# Patient Record
Sex: Male | Born: 1935 | Race: Black or African American | Hispanic: No | Marital: Married | Smoking: Never smoker
Health system: Southern US, Community
[De-identification: ages and names within clinical notes are randomized; demographics above are authoritative.]

## PROBLEM LIST (undated history)

## (undated) DIAGNOSIS — E785 Hyperlipidemia, unspecified: Secondary | ICD-10-CM

## (undated) DIAGNOSIS — F0390 Unspecified dementia without behavioral disturbance: Secondary | ICD-10-CM

## (undated) DIAGNOSIS — E119 Type 2 diabetes mellitus without complications: Secondary | ICD-10-CM

## (undated) DIAGNOSIS — N183 Chronic kidney disease, stage 3 unspecified: Secondary | ICD-10-CM

## (undated) DIAGNOSIS — D649 Anemia, unspecified: Secondary | ICD-10-CM

## (undated) DIAGNOSIS — I10 Essential (primary) hypertension: Secondary | ICD-10-CM

---

## 2004-06-12 ENCOUNTER — Ambulatory Visit: Payer: Self-pay

## 2004-06-30 ENCOUNTER — Ambulatory Visit: Payer: Self-pay | Admitting: Internal Medicine

## 2004-07-31 ENCOUNTER — Ambulatory Visit: Payer: Self-pay | Admitting: Internal Medicine

## 2004-11-22 ENCOUNTER — Emergency Department: Payer: Self-pay | Admitting: Emergency Medicine

## 2007-10-12 ENCOUNTER — Emergency Department: Payer: Self-pay | Admitting: Emergency Medicine

## 2007-10-12 ENCOUNTER — Other Ambulatory Visit: Payer: Self-pay

## 2015-11-01 ENCOUNTER — Emergency Department: Payer: Medicare (Managed Care)

## 2015-11-01 ENCOUNTER — Observation Stay
Admission: EM | Admit: 2015-11-01 | Discharge: 2015-11-03 | Disposition: A | Discharge status: Skilled Nursing Facility | Payer: Medicare (Managed Care) | Attending: Internal Medicine | Admitting: Internal Medicine

## 2015-11-01 ENCOUNTER — Encounter: Payer: Self-pay | Admitting: Emergency Medicine

## 2015-11-01 DIAGNOSIS — E785 Hyperlipidemia, unspecified: Secondary | ICD-10-CM | POA: Insufficient documentation

## 2015-11-01 DIAGNOSIS — I517 Cardiomegaly: Secondary | ICD-10-CM | POA: Insufficient documentation

## 2015-11-01 DIAGNOSIS — Z7982 Long term (current) use of aspirin: Secondary | ICD-10-CM | POA: Insufficient documentation

## 2015-11-01 DIAGNOSIS — J111 Influenza due to unidentified influenza virus with other respiratory manifestations: Secondary | ICD-10-CM

## 2015-11-01 DIAGNOSIS — I350 Nonrheumatic aortic (valve) stenosis: Secondary | ICD-10-CM | POA: Insufficient documentation

## 2015-11-01 DIAGNOSIS — R52 Pain, unspecified: Secondary | ICD-10-CM

## 2015-11-01 DIAGNOSIS — E860 Dehydration: Secondary | ICD-10-CM | POA: Diagnosis present

## 2015-11-01 DIAGNOSIS — R600 Localized edema: Secondary | ICD-10-CM | POA: Diagnosis not present

## 2015-11-01 DIAGNOSIS — R778 Other specified abnormalities of plasma proteins: Secondary | ICD-10-CM

## 2015-11-01 DIAGNOSIS — N19 Unspecified kidney failure: Secondary | ICD-10-CM

## 2015-11-01 DIAGNOSIS — J101 Influenza due to other identified influenza virus with other respiratory manifestations: Secondary | ICD-10-CM | POA: Diagnosis not present

## 2015-11-01 DIAGNOSIS — F0391 Unspecified dementia with behavioral disturbance: Secondary | ICD-10-CM | POA: Diagnosis not present

## 2015-11-01 DIAGNOSIS — E1122 Type 2 diabetes mellitus with diabetic chronic kidney disease: Secondary | ICD-10-CM | POA: Insufficient documentation

## 2015-11-01 DIAGNOSIS — R32 Unspecified urinary incontinence: Secondary | ICD-10-CM | POA: Insufficient documentation

## 2015-11-01 DIAGNOSIS — R740 Nonspecific elevation of levels of transaminase and lactic acid dehydrogenase [LDH]: Secondary | ICD-10-CM | POA: Insufficient documentation

## 2015-11-01 DIAGNOSIS — E119 Type 2 diabetes mellitus without complications: Secondary | ICD-10-CM

## 2015-11-01 DIAGNOSIS — F0390 Unspecified dementia without behavioral disturbance: Secondary | ICD-10-CM | POA: Diagnosis not present

## 2015-11-01 DIAGNOSIS — R531 Weakness: Secondary | ICD-10-CM | POA: Diagnosis not present

## 2015-11-01 DIAGNOSIS — N179 Acute kidney failure, unspecified: Secondary | ICD-10-CM | POA: Diagnosis not present

## 2015-11-01 DIAGNOSIS — N183 Chronic kidney disease, stage 3 unspecified: Secondary | ICD-10-CM

## 2015-11-01 DIAGNOSIS — R748 Abnormal levels of other serum enzymes: Secondary | ICD-10-CM | POA: Diagnosis not present

## 2015-11-01 DIAGNOSIS — I129 Hypertensive chronic kidney disease with stage 1 through stage 4 chronic kidney disease, or unspecified chronic kidney disease: Secondary | ICD-10-CM | POA: Diagnosis not present

## 2015-11-01 DIAGNOSIS — R7401 Elevation of levels of liver transaminase levels: Secondary | ICD-10-CM

## 2015-11-01 DIAGNOSIS — K219 Gastro-esophageal reflux disease without esophagitis: Secondary | ICD-10-CM | POA: Diagnosis not present

## 2015-11-01 DIAGNOSIS — R7989 Other specified abnormal findings of blood chemistry: Secondary | ICD-10-CM

## 2015-11-01 HISTORY — DX: Anemia, unspecified: D64.9

## 2015-11-01 HISTORY — DX: Chronic kidney disease, stage 3 (moderate): N18.3

## 2015-11-01 HISTORY — DX: Hyperlipidemia, unspecified: E78.5

## 2015-11-01 HISTORY — DX: Unspecified dementia, unspecified severity, without behavioral disturbance, psychotic disturbance, mood disturbance, and anxiety: F03.90

## 2015-11-01 HISTORY — DX: Essential (primary) hypertension: I10

## 2015-11-01 HISTORY — DX: Chronic kidney disease, stage 3 unspecified: N18.30

## 2015-11-01 LAB — CBC
HCT: 39.9 % — ABNORMAL LOW (ref 40.0–52.0)
Hemoglobin: 13 g/dL (ref 13.0–18.0)
MCH: 29.5 pg (ref 26.0–34.0)
MCHC: 32.5 g/dL (ref 32.0–36.0)
MCV: 90.6 fL (ref 80.0–100.0)
Platelets: 146 10*3/uL — ABNORMAL LOW (ref 150–440)
RBC: 4.4 MIL/uL (ref 4.40–5.90)
RDW: 13.4 % (ref 11.5–14.5)
WBC: 12.5 10*3/uL — ABNORMAL HIGH (ref 3.8–10.6)

## 2015-11-01 LAB — COMPREHENSIVE METABOLIC PANEL
ALT: 38 U/L (ref 17–63)
AST: 152 U/L — AB (ref 15–41)
Albumin: 3.6 g/dL (ref 3.5–5.0)
Alkaline Phosphatase: 40 U/L (ref 38–126)
Anion gap: 10 (ref 5–15)
BUN: 27 mg/dL — AB (ref 6–20)
CHLORIDE: 106 mmol/L (ref 101–111)
CO2: 21 mmol/L — AB (ref 22–32)
CREATININE: 1.32 mg/dL — AB (ref 0.61–1.24)
Calcium: 8.1 mg/dL — ABNORMAL LOW (ref 8.9–10.3)
GFR calc non Af Amer: 45 mL/min — ABNORMAL LOW (ref >60)
GFR, EST AFRICAN AMERICAN: 53 mL/min — AB (ref >60)
Glucose, Bld: 286 mg/dL — ABNORMAL HIGH (ref 65–99)
POTASSIUM: 4.2 mmol/L (ref 3.5–5.1)
SODIUM: 137 mmol/L (ref 135–145)
Total Bilirubin: 0.6 mg/dL (ref 0.3–1.2)
Total Protein: 7 g/dL (ref 6.5–8.1)

## 2015-11-01 LAB — URINALYSIS COMPLETE WITH MICROSCOPIC (ARMC ONLY)
Bacteria, UA: NONE SEEN
Bilirubin Urine: NEGATIVE
LEUKOCYTES UA: NEGATIVE
Nitrite: NEGATIVE
Protein, ur: 100 mg/dL — AB
SPECIFIC GRAVITY, URINE: 1.025 (ref 1.005–1.030)
pH: 5 (ref 5.0–8.0)

## 2015-11-01 LAB — CREATININE, SERUM
CREATININE: 1.19 mg/dL (ref 0.61–1.24)
GFR calc Af Amer: 60 mL/min — ABNORMAL LOW (ref >60)
GFR, EST NON AFRICAN AMERICAN: 52 mL/min — AB (ref >60)

## 2015-11-01 LAB — CBC WITH DIFFERENTIAL/PLATELET
BASOS PCT: 0 %
Basophils Absolute: 0 10*3/uL (ref 0–0.1)
Eosinophils Absolute: 0 10*3/uL (ref 0–0.7)
Eosinophils Relative: 0 %
HEMATOCRIT: 39.1 % — AB (ref 40.0–52.0)
HEMOGLOBIN: 13 g/dL (ref 13.0–18.0)
Lymphocytes Relative: 5 %
Lymphs Abs: 0.6 10*3/uL — ABNORMAL LOW (ref 1.0–3.6)
MCH: 29.6 pg (ref 26.0–34.0)
MCHC: 33.3 g/dL (ref 32.0–36.0)
MCV: 88.8 fL (ref 80.0–100.0)
MONOS PCT: 6 %
Monocytes Absolute: 0.7 10*3/uL (ref 0.2–1.0)
NEUTROS ABS: 10.5 10*3/uL — AB (ref 1.4–6.5)
NEUTROS PCT: 89 %
PLATELETS: 143 10*3/uL — AB (ref 150–440)
RBC: 4.4 MIL/uL (ref 4.40–5.90)
RDW: 13.4 % (ref 11.5–14.5)
WBC: 11.8 10*3/uL — ABNORMAL HIGH (ref 3.8–10.6)

## 2015-11-01 LAB — GLUCOSE, CAPILLARY: GLUCOSE-CAPILLARY: 155 mg/dL — AB (ref 65–99)

## 2015-11-01 LAB — TROPONIN I
Troponin I: 0.2 ng/mL — ABNORMAL HIGH (ref <0.031)
Troponin I: 0.25 ng/mL — ABNORMAL HIGH (ref <0.031)

## 2015-11-01 LAB — MAGNESIUM: Magnesium: 2.2 mg/dL (ref 1.7–2.4)

## 2015-11-01 MED ORDER — HALOPERIDOL LACTATE 5 MG/ML IJ SOLN
5.0000 mg | Freq: Once | INTRAMUSCULAR | Status: AC
  Administered 2015-11-01: 5 mg via INTRAMUSCULAR
  Filled 2015-11-01: qty 1

## 2015-11-01 MED ORDER — ONDANSETRON HCL 4 MG PO TABS: 4.0000 mg | ORAL_TABLET | Freq: Four times a day (QID) | ORAL | Status: DC | PRN

## 2015-11-01 MED ORDER — LISINOPRIL 10 MG PO TABS
10.0000 mg | ORAL_TABLET | Freq: Every day | ORAL | Status: DC
  Administered 2015-11-01 – 2015-11-02 (×2): 10 mg via ORAL
  Filled 2015-11-01 (×3): qty 1

## 2015-11-01 MED ORDER — ONDANSETRON HCL 4 MG/2ML IJ SOLN: 4.0000 mg | Freq: Four times a day (QID) | INTRAMUSCULAR | Status: DC | PRN

## 2015-11-01 MED ORDER — SODIUM CHLORIDE 0.9 % IV SOLN
Freq: Once | INTRAVENOUS | Status: AC
  Administered 2015-11-01: 15:00:00 via INTRAVENOUS

## 2015-11-01 MED ORDER — LEVOFLOXACIN IN D5W 250 MG/50ML IV SOLN
250.0000 mg | INTRAVENOUS | Status: DC
  Administered 2015-11-02: 250 mg via INTRAVENOUS
  Filled 2015-11-01 (×2): qty 50

## 2015-11-01 MED ORDER — SODIUM CHLORIDE 0.9 % IV SOLN: 250.0000 mL | INTRAVENOUS | Status: DC | PRN

## 2015-11-01 MED ORDER — SODIUM CHLORIDE 0.9% FLUSH
3.0000 mL | Freq: Two times a day (BID) | INTRAVENOUS | Status: DC
  Administered 2015-11-01 – 2015-11-02 (×2): 3 mL via INTRAVENOUS

## 2015-11-01 MED ORDER — HALOPERIDOL LACTATE 5 MG/ML IJ SOLN: 1.0000 mg | Freq: Four times a day (QID) | INTRAMUSCULAR | Status: DC | PRN

## 2015-11-01 MED ORDER — PANTOPRAZOLE SODIUM 40 MG PO TBEC
40.0000 mg | DELAYED_RELEASE_TABLET | Freq: Every day | ORAL | Status: DC
  Administered 2015-11-01 – 2015-11-02 (×2): 40 mg via ORAL
  Filled 2015-11-01 (×3): qty 1

## 2015-11-01 MED ORDER — SODIUM CHLORIDE 0.9% FLUSH
3.0000 mL | Freq: Two times a day (BID) | INTRAVENOUS | Status: DC
  Administered 2015-11-02: 3 mL via INTRAVENOUS

## 2015-11-01 MED ORDER — HEPARIN SODIUM (PORCINE) 5000 UNIT/ML IJ SOLN
5000.0000 [IU] | Freq: Three times a day (TID) | INTRAMUSCULAR | Status: DC
  Administered 2015-11-01 – 2015-11-03 (×5): 5000 [IU] via SUBCUTANEOUS
  Filled 2015-11-01 (×5): qty 1

## 2015-11-01 MED ORDER — SODIUM CHLORIDE 0.9% FLUSH: 3.0000 mL | INTRAVENOUS | Status: DC | PRN

## 2015-11-01 MED ORDER — ACETAMINOPHEN 650 MG RE SUPP: 650.0000 mg | Freq: Four times a day (QID) | RECTAL | Status: DC | PRN

## 2015-11-01 MED ORDER — INSULIN ASPART 100 UNIT/ML ~~LOC~~ SOLN: 3.0000 [IU] | Freq: Three times a day (TID) | SUBCUTANEOUS | Status: DC

## 2015-11-01 MED ORDER — INSULIN ASPART 100 UNIT/ML ~~LOC~~ SOLN: 0.0000 [IU] | Freq: Every day | SUBCUTANEOUS | Status: DC

## 2015-11-01 MED ORDER — LEVOFLOXACIN IN D5W 500 MG/100ML IV SOLN
500.0000 mg | INTRAVENOUS | Status: DC
  Administered 2015-11-01: 500 mg via INTRAVENOUS
  Filled 2015-11-01: qty 100

## 2015-11-01 MED ORDER — METOPROLOL TARTRATE 25 MG PO TABS
25.0000 mg | ORAL_TABLET | Freq: Two times a day (BID) | ORAL | Status: DC
  Administered 2015-11-01 – 2015-11-02 (×4): 25 mg via ORAL
  Filled 2015-11-01 (×5): qty 1

## 2015-11-01 MED ORDER — OSELTAMIVIR PHOSPHATE 30 MG PO CAPS
30.0000 mg | ORAL_CAPSULE | Freq: Two times a day (BID) | ORAL | Status: DC
  Administered 2015-11-01 – 2015-11-02 (×3): 30 mg via ORAL
  Filled 2015-11-01 (×4): qty 1

## 2015-11-01 MED ORDER — OSELTAMIVIR PHOSPHATE 75 MG PO CAPS: 75.0000 mg | ORAL_CAPSULE | Freq: Two times a day (BID) | ORAL | Status: DC

## 2015-11-01 MED ORDER — ACETAMINOPHEN 325 MG PO TABS: 650.0000 mg | ORAL_TABLET | Freq: Four times a day (QID) | ORAL | Status: DC | PRN

## 2015-11-01 MED ORDER — INSULIN ASPART 100 UNIT/ML ~~LOC~~ SOLN: 0.0000 [IU] | Freq: Three times a day (TID) | SUBCUTANEOUS | Status: DC

## 2015-11-01 MED ORDER — PRAVASTATIN SODIUM 20 MG PO TABS
40.0000 mg | ORAL_TABLET | Freq: Every day | ORAL | Status: DC
  Administered 2015-11-01 – 2015-11-02 (×2): 40 mg via ORAL
  Filled 2015-11-01 (×2): qty 2

## 2015-11-01 MED ORDER — ASPIRIN EC 81 MG PO TBEC
81.0000 mg | DELAYED_RELEASE_TABLET | Freq: Every day | ORAL | Status: DC
  Administered 2015-11-01 – 2015-11-02 (×2): 81 mg via ORAL
  Filled 2015-11-01 (×3): qty 1

## 2015-11-01 MED ORDER — DONEPEZIL HCL 5 MG PO TABS
5.0000 mg | ORAL_TABLET | Freq: Every day | ORAL | Status: DC
  Administered 2015-11-01 – 2015-11-02 (×2): 5 mg via ORAL
  Filled 2015-11-01 (×2): qty 1

## 2015-11-01 NOTE — ED Notes (Signed)
Pt to us via stretcher

## 2015-11-01 NOTE — Progress Notes (Signed)
Patient admitted to unit. Oriented to room, call bell, and staff. Bed in lowest position. Fall safety plan reviewed. Full assessment to Epic. Skin assessment verified with Bubba Campheryl Green RN. Telemetry box verification with tele clerk and Thayer OhmChris NT- Box#: [id number]. Will continue to monitor.

## 2015-11-01 NOTE — ED Notes (Signed)
Per EMS patient diagnosed with flu yesterday.  Today, patient family reports patient is weak and unable to walk.  Patient lives at home.  Patient has history of dementia and is AAOx1, which is baseline.

## 2015-11-01 NOTE — ED Provider Notes (Signed)
[facility] Emergency Department Provider Note  ____________________________________________  Time seen: Seen upon arrival to the emergency department  I have reviewed the triage vital signs and the nursing notes.   HISTORY  Chief Complaint Weakness    HPI Edward Kim is a 82 y.o. male with a history of dementia who is presenting today with weakness. Per EMS, the patient has not been walking today. He was recently diagnosed with flu yesterday. Patient denying any complaints at this time but he has dementia. There is no family at the bedside to assist with any further history. I attempted to call the number listed on the EMR registration and the number is disconnected.   Past Medical History  Diagnosis Date  . Dementia     There are no active problems to display for this patient.   No past surgical history on file.  Current Outpatient Rx  Name  Route  Sig  Dispense  Refill  . aspirin EC 81 MG tablet   Oral   Take 81 mg by mouth daily.         Marland Kitchen. donepezil (ARICEPT) 5 MG tablet   Oral   Take 5 mg by mouth at bedtime.         . lovastatin (MEVACOR) 40 MG tablet   Oral   Take 80 mg by mouth at bedtime.         Marland Kitchen. oseltamivir (TAMIFLU) 75 MG capsule   Oral   Take 75 mg by mouth 2 (two) times daily.         . pantoprazole (PROTONIX) 40 MG tablet   Oral   Take 40 mg by mouth daily.         . quinapril (ACCUPRIL) 10 MG tablet   Oral   Take 10 mg by mouth daily.           Allergies Review of patient's allergies indicates no known allergies.  No family history on file.  Social History Social History  Substance Use Topics  . Smoking status: Unknown If Ever Smoked  . Smokeless tobacco: None  . Alcohol Use: None    Review of Systems Constitutional: No fever/chills Eyes: No visual changes. ENT: No sore throat. Cardiovascular: Denies chest pain. Respiratory: Denies shortness of breath. Gastrointestinal: No abdominal pain.   No nausea, no vomiting.  No diarrhea.  No constipation. Genitourinary: Negative for dysuria. Musculoskeletal: Negative for back pain. Skin: Negative for rash. Neurological: Negative for headaches, focal weakness or numbness. However, the history is confounded by the patient's dementia. 10-point ROS otherwise negative.  ____________________________________________   PHYSICAL EXAM:  VITAL SIGNS: ED Triage Vitals  Enc Vitals Group     BP 11/01/15 1230 159/69 mmHg     Pulse Rate 11/01/15 1230 84     Resp 11/01/15 1230 16     Temp 11/01/15 1230 98.1 F (36.7 C)     Temp Source 11/01/15 1230 Oral     SpO2 11/01/15 1230 97 %     Weight 11/01/15 1230 160 lb (72.576 kg)     Height 11/01/15 1230 5\' 9"  (1.753 m)     Head Cir --      Peak Flow --      Pain Score --      Pain Loc --      Pain Edu? --      Excl. in GC? --     Constitutional: Alert and orientedTo self only. Well appearing and in no acute distress. Eyes: Conjunctivae  are normal. PERRL. EOMI. Head: Atraumatic. Nose: No congestion/rhinnorhea. Mouth/Throat: Mucous membranes are moist.  Oropharynx non-erythematous. Neck: No stridor.   Cardiovascular: Normal rate, regular rhythm. Grossly normal heart sounds.  Good peripheral circulation. Respiratory: Normal respiratory effort.  No retractions. Lungs CTAB. Gastrointestinal: Soft and nontender. No distention.  No CVA tenderness. Musculoskeletal: Left lower extremity swelling with pitting edema which is moderate. Unclear if this is acute or chronic.  No joint effusions. Neurologic:  Normal speech and language. 3 out of 5 strength to bilateral lower extremities. Sensation is intact to light touch. Skin:  Skin is warm, dry and intact. No rash noted. Psychiatric: Mood and affect are normal. Speech and behavior are normal.  ____________________________________________   LABS (all labs ordered are listed, but only abnormal results are displayed)  Labs Reviewed  CBC WITH  DIFFERENTIAL/PLATELET - Abnormal; Notable for the following:    WBC 11.8 (*)    HCT 39.1 (*)    Platelets 143 (*)    Neutro Abs 10.5 (*)    Lymphs Abs 0.6 (*)    All other components within normal limits  COMPREHENSIVE METABOLIC PANEL - Abnormal; Notable for the following:    CO2 21 (*)    Glucose, Bld 286 (*)    BUN 27 (*)    Creatinine, Ser 1.32 (*)    Calcium 8.1 (*)    AST 152 (*)    GFR calc non Af Amer 45 (*)    GFR calc Af Amer 53 (*)    All other components within normal limits  TROPONIN I - Abnormal; Notable for the following:    Troponin I 0.20 (*)    All other components within normal limits  URINALYSIS COMPLETEWITH MICROSCOPIC ([facility] ONLY) - Abnormal; Notable for the following:    Color, Urine YELLOW (*)    APPearance HAZY (*)    Glucose, UA >500 (*)    Ketones, ur TRACE (*)    Hgb urine dipstick 3+ (*)    Protein, ur 100 (*)    Squamous Epithelial / LPF 0-5 (*)    All other components within normal limits   ____________________________________________  EKG  ED ECG REPORT I, Arelia Longest, the attending physician, personally viewed and interpreted this ECG.   Date: 11/01/2015  EKG Time: 1231  Rate: 80  Rhythm: normal sinus rhythm  Axis: Normal  Intervals:none  ST&T Change: No ST segment elevation or depression. No abnormal T-wave inversion. Biphasic T waves in V2 through 5.  ____________________________________________  RADIOLOGY    Imaging Results       DG Chest 2 View (Final result) Result time: 11/01/15 14:36:07   Final result by Rad Results In Interface (11/01/15 14:36:07)   Narrative:   CLINICAL DATA: Patient diagnosed with flu. Weak and unable to walk. Dementia.  EXAM: CHEST 2 VIEW  COMPARISON: 10/13/2007.  FINDINGS: Cardiomegaly. Otherwise unremarkable mediastinal contours. Calcification of the thoracic aorta. No infiltrates or failure. No effusion or pneumothorax. No thoracic compression deformity or osseous  destructive lesion.  IMPRESSION: Cardiomegaly. No active disease.   Electronically Signed By: Elsie Stain M.D. On: 11/01/2015 14:36          US Venous Img Lower Unilateral Left (Final result) Result time: 11/01/15 13:55:23   Final result by Rad Results In Interface (11/01/15 13:55:23)   Narrative:   CLINICAL DATA: Lower extremity edema  EXAM: LEFT LOWER EXTREMITY VENOUS DOPPLER ULTRASOUND  TECHNIQUE: Gray-scale sonography with graded compression, as well as color Doppler and duplex ultrasound  were performed to evaluate the lower extremity deep venous systems from the level of the common femoral vein and including the common femoral, femoral, profunda femoral, popliteal and calf veins including the posterior tibial, peroneal and gastrocnemius veins when visible. The superficial great saphenous vein was also interrogated. Spectral Doppler was utilized to evaluate flow at rest and with distal augmentation maneuvers in the common femoral, femoral and popliteal veins.  COMPARISON: None.  FINDINGS: Contralateral Common Femoral Vein: Respiratory phasicity is normal and symmetric with the symptomatic side. No evidence of thrombus. Normal compressibility.  Common Femoral Vein: No evidence of thrombus. Normal compressibility, respiratory phasicity and response to augmentation.  Saphenofemoral Junction: No evidence of thrombus. Normal compressibility and flow on color Doppler imaging.  Profunda Femoral Vein: No evidence of thrombus. Normal compressibility and flow on color Doppler imaging.  Femoral Vein: No evidence of thrombus. Normal compressibility, respiratory phasicity and response to augmentation.  Popliteal Vein: No evidence of thrombus. Normal compressibility, respiratory phasicity and response to augmentation.  Calf Veins: No evidence of thrombus. Normal compressibility and flow on color Doppler imaging.  Superficial Great Saphenous Vein: No  evidence of thrombus. Normal compressibility and flow on color Doppler imaging.  Venous Reflux: None.  Other Findings: None.  IMPRESSION: No evidence of deep venous thrombosis.   Electronically Signed By: Elige Ko On: 11/01/2015 13:55       ____________________________________________   PROCEDURES   ____________________________________________   INITIAL IMPRESSION / ASSESSMENT AND PLAN / ED COURSE  Pertinent labs & imaging results that were available during my care of the patient were reviewed by me and considered in my medical decision making (see chart for details).  ----------------------------------------- 2:42 PM on 11/01/2015 -----------------------------------------  Patient workup remarkable for troponin of 0.2 as well as mild renal failure. Likely deconditioned secondary to influenza. Will admit to the hospital. Signed out to Dr. Seth Bake. ____________________________________________   FINAL CLINICAL IMPRESSION(S) / ED DIAGNOSES  Influenza. Renal failure.    Myrna Blazer, MD 11/01/15 [phone]

## 2015-11-01 NOTE — ED Notes (Signed)
Pt attempting to get out of bed, pt only oriented to self,  Called charge rn and requested a sitter for pt.  Pt alert, also noted to have been incontinent of urine,  Pt cleaned, sheets cleaned and pt pulled up in bed, sitter at bedside with pt now.

## 2015-11-01 NOTE — H&P (Addendum)
[facility] - [place] [facility]   PATIENT NAME: Edward Kim    MR#:  [id number]  DATE OF BIRTH:  [birth year]  DATE OF ADMISSION:  11/01/2015  PRIMARY CARE PHYSICIAN: No primary care provider on file.   REQUESTING/REFERRING PHYSICIAN:   CHIEF COMPLAINT:   Chief Complaint  Patient presents with  . Weakness    HISTORY OF PRESENT ILLNESS: Khaleem Burchill  is a 82 y.o. male with a known history of recent diagnosis of flu, diabetes mellitus with CK D stage III, hypertension, hyperlipidemia, anemia, urinary incontinence, dementia, who presents to the hospital with complaints of generalized weakness. Unfortunately, patient himself is not able to provide history due to dementia, no family members available at the bedside. Patient's labs revealed mild elevation of troponin, leukocytosis, hematuria. Chest x-ray showed cardiomegaly, ultrasound of left lower extremity revealed no DVT. Hospitalist services were contacted for admission  PAST MEDICAL HISTORY:   Past Medical History  Diagnosis Date  . Dementia     PAST SURGICAL HISTORY: No past surgical history on file.  SOCIAL HISTORY:  Social History  Substance Use Topics  . Smoking status: Unknown If Ever Smoked  . Smokeless tobacco: Not on file  . Alcohol Use: Not on file    FAMILY HISTORY: No family history on file.  DRUG ALLERGIES: No Known Allergies  ROS  MEDICATIONS AT HOME:  Prior to Admission medications   Medication Sig Start Date End Date Taking? Authorizing Provider  aspirin EC 81 MG tablet Take 81 mg by mouth daily.   Yes Historical Provider, MD  donepezil (ARICEPT) 5 MG tablet Take 5 mg by mouth at bedtime.   Yes Historical Provider, MD  lovastatin (MEVACOR) 40 MG tablet Take 80 mg by mouth at bedtime.   Yes Historical Provider, MD  oseltamivir (TAMIFLU) 75 MG capsule Take 75 mg by mouth 2 (two) times daily. 10/30/15 11/04/15 Yes Historical Provider, MD  pantoprazole (PROTONIX) 40 MG tablet Take  40 mg by mouth daily.   Yes Historical Provider, MD  quinapril (ACCUPRIL) 10 MG tablet Take 10 mg by mouth daily.   Yes Historical Provider, MD      PHYSICAL EXAMINATION:   VITAL SIGNS: Blood pressure 167/84, pulse 82, temperature 98.1 F (36.7 C), temperature source Oral, resp. rate 20, height  (1.753 m), weight 72.576 kg (160 lb), SpO2 100 %.  GENERAL:  82 y.o.-year-old patient lying in the bed with no acute distress. Laying on the bed, oriented just to himself EYES: Pupils equal, round, reactive to light and accommodation. No scleral icterus. Extraocular muscles intact.  HEENT: Head atraumatic, normocephalic. Oropharynx and nasopharynx clear.  NECK:  Supple, no jugular venous distention. No thyroid enlargement, no tenderness.  LUNGS: Normal breath sounds bilaterally, no wheezing, rales,rhonchi or crepitation. No use of accessory muscles of respiration.  CARDIOVASCULAR: S1, S2 normal. 2/6 systolic murmur precordially, no rubs, or gallops.  ABDOMEN: Soft, nontender, nondistended. Bowel sounds present. No organomegaly or mass.  EXTREMITIES: 1+ left lower extremity and pedal edema, no cyanosis, or clubbing.  NEUROLOGIC: Cranial nerves II through XII are grossly intact. Muscle strength , difficult to examine due to dementia, he moves all extremities. Sensation grossly intact. Gait not checked.  PSYCHIATRIC: The patient is alert and oriented x 1.  SKIN: No obvious rash, lesion, or ulcer.   LABORATORY PANEL:   CBC  Recent Labs Lab 11/01/15 1301  WBC 11.8*  HGB 13.0  HCT 39.1*  PLT 143*  MCV 88.8  MCH 29.6  MCHC 33.3  RDW 13.4  LYMPHSABS 0.6*  MONOABS 0.7  EOSABS 0.0  BASOSABS 0.0   ------------------------------------------------------------------------------------------------------------------  Chemistries   Recent Labs Lab 11/01/15 1301  NA 137  K 4.2  CL 106  CO2 21*  GLUCOSE 286*  BUN 27*  CREATININE 1.32*  CALCIUM 8.1*  AST 152*  ALT 38  ALKPHOS 40   BILITOT 0.6   ------------------------------------------------------------------------------------------------------------------  Cardiac Enzymes  Recent Labs Lab 11/01/15 1301  TROPONINI 0.20*   ------------------------------------------------------------------------------------------------------------------  RADIOLOGY: Dg Chest 2 View  11/01/2015  CLINICAL DATA:  Patient diagnosed with flu. Weak and unable to walk. Dementia. EXAM: CHEST  2 VIEW COMPARISON:  10/13/2007. FINDINGS: Cardiomegaly. Otherwise unremarkable mediastinal contours. Calcification of the thoracic aorta. No infiltrates or failure. No effusion or pneumothorax. No thoracic compression deformity or osseous destructive lesion. IMPRESSION: Cardiomegaly.  No active disease. Electronically Signed   By: Elsie Stain M.D.   On: 11/01/2015 14:36   US Venous Img Lower Unilateral Left  11/01/2015  CLINICAL DATA:  Lower extremity edema EXAM: LEFT LOWER EXTREMITY VENOUS DOPPLER ULTRASOUND TECHNIQUE: Gray-scale sonography with graded compression, as well as color Doppler and duplex ultrasound were performed to evaluate the lower extremity deep venous systems from the level of the common femoral vein and including the common femoral, femoral, profunda femoral, popliteal and calf veins including the posterior tibial, peroneal and gastrocnemius veins when visible. The superficial great saphenous vein was also interrogated. Spectral Doppler was utilized to evaluate flow at rest and with distal augmentation maneuvers in the common femoral, femoral and popliteal veins. COMPARISON:  None. FINDINGS: Contralateral Common Femoral Vein: Respiratory phasicity is normal and symmetric with the symptomatic side. No evidence of thrombus. Normal compressibility. Common Femoral Vein: No evidence of thrombus. Normal compressibility, respiratory phasicity and response to augmentation. Saphenofemoral Junction: No evidence of thrombus. Normal compressibility  and flow on color Doppler imaging. Profunda Femoral Vein: No evidence of thrombus. Normal compressibility and flow on color Doppler imaging. Femoral Vein: No evidence of thrombus. Normal compressibility, respiratory phasicity and response to augmentation. Popliteal Vein: No evidence of thrombus. Normal compressibility, respiratory phasicity and response to augmentation. Calf Veins: No evidence of thrombus. Normal compressibility and flow on color Doppler imaging. Superficial Great Saphenous Vein: No evidence of thrombus. Normal compressibility and flow on color Doppler imaging. Venous Reflux:  None. Other Findings:  None. IMPRESSION: No evidence of deep venous thrombosis. Electronically Signed   By: Elige Ko   On: 11/01/2015 13:55    EKG: Orders placed or performed during the hospital encounter of 11/01/15  . EKG 12-Lead  . EKG 12-Lead  . ED EKG  . ED EKG    IMPRESSION AND PLAN:  Active Problems:   Influenza   Generalized weakness   CKD (chronic kidney disease), stage III   Diabetes mellitus (HCC)   Elevated transaminase level   Elevated troponin  #1. Influenza, unclear how long ago it was diagnosed, continue Tamiflu, as levofloxacin. Get sputum culture, as patient admits of dark sputum #2. Generalized weakness, likely influenza related, and physical therapist involved. #3. Elevated troponin, initiate patient on metoprolol, continue aspirin, lovastatin, get echo and cardiology consultation #4. CK D stage III, seems to be stable, follow closely. #5. Diabetes mellitus, get hemoglobin A1c, continue outpatient medications, sliding scale insulin, diabetic diet. #6. Elevated transaminase level, patient admits of drinking alcohol, get ultrasound of abdomen, patient is otherwise asymptomatic #7. Alcohol abuse, per history, watch for withdrawal #8 hematuria, get urine cultures to rule out tertiary infection,  now patient is on levofloxacin for suspected upper respiratory infection, change  antibiotics depending on culture results  PS discussed with patient's niece, power of attorney for medical care, apparently the patient was diagnosed with influenza on Monday, 3 days ago, initiated on Tamiflu, patient belongs to [facility]. He has been noted to have very poor appetite, significant weakness since 3 days ago. Time spent approximately 15 minutes on discussions All the records are reviewed and case discussed with ED provider. Management plans discussed with the patient, family and they are in agreement.  CODE STATUS: Code Status History    This patient does not have a recorded code status. Please follow your organizational policy for patients in this situation.       TOTAL TIME TAKING CARE OF THIS PATIENT: 50 minutes.    Katharina CaperVAICKUTE,Flara Storti M.D on 11/01/2015 at 3:07 PM  Between 7am to 6pm - Pager - [phone] After 6pm go to [email or web address] - password [email or web address] [facility]  [facility]  Office  [phone]  CC: Primary care physician; No primary care provider on file.

## 2015-11-01 NOTE — Progress Notes (Signed)
Orders for tamiflu 75 mg PO BID and levofloxacin 500 mg IV daily were changed to tamiflu 30 mg PO BID and levofloxacin 500 mg IV once today followed by 250 mg IV daily per renal function per protocol.  Cindi CarbonMary M Seleni Meller, PharmD 11/01/15 6:07 PM

## 2015-11-02 ENCOUNTER — Encounter: Payer: Self-pay | Admitting: Physician Assistant

## 2015-11-02 ENCOUNTER — Observation Stay
Admit: 2015-11-02 | Discharge: 2015-11-02 | Disposition: A | Discharge status: Home / Self Care | Payer: Medicare (Managed Care) | Attending: Internal Medicine | Admitting: Internal Medicine

## 2015-11-02 ENCOUNTER — Observation Stay: Payer: Medicare (Managed Care)

## 2015-11-02 DIAGNOSIS — R531 Weakness: Secondary | ICD-10-CM

## 2015-11-02 DIAGNOSIS — E1159 Type 2 diabetes mellitus with other circulatory complications: Secondary | ICD-10-CM

## 2015-11-02 DIAGNOSIS — N183 Chronic kidney disease, stage 3 (moderate): Secondary | ICD-10-CM | POA: Diagnosis not present

## 2015-11-02 DIAGNOSIS — R7989 Other specified abnormal findings of blood chemistry: Secondary | ICD-10-CM

## 2015-11-02 DIAGNOSIS — J111 Influenza due to unidentified influenza virus with other respiratory manifestations: Secondary | ICD-10-CM

## 2015-11-02 LAB — BASIC METABOLIC PANEL
Anion gap: 7 (ref 5–15)
BUN: 22 mg/dL — ABNORMAL HIGH (ref 6–20)
CHLORIDE: 102 mmol/L (ref 101–111)
CO2: 29 mmol/L (ref 22–32)
CREATININE: 1.05 mg/dL (ref 0.61–1.24)
Calcium: 8.6 mg/dL — ABNORMAL LOW (ref 8.9–10.3)
GFR, EST NON AFRICAN AMERICAN: 60 mL/min — AB (ref >60)
Glucose, Bld: 126 mg/dL — ABNORMAL HIGH (ref 65–99)
Potassium: 3.7 mmol/L (ref 3.5–5.1)
SODIUM: 138 mmol/L (ref 135–145)

## 2015-11-02 LAB — GLUCOSE, CAPILLARY
GLUCOSE-CAPILLARY: 141 mg/dL — AB (ref 65–99)
GLUCOSE-CAPILLARY: 163 mg/dL — AB (ref 65–99)
Glucose-Capillary: 166 mg/dL — ABNORMAL HIGH (ref 65–99)

## 2015-11-02 LAB — ECHOCARDIOGRAM COMPLETE
Height: 69 in
Weight: 2940.8 oz

## 2015-11-02 LAB — HEMOGLOBIN A1C: HEMOGLOBIN A1C: 7.3 % — AB (ref 4.0–6.0)

## 2015-11-02 LAB — CBC
HCT: 40.4 % (ref 40.0–52.0)
Hemoglobin: 13.5 g/dL (ref 13.0–18.0)
MCH: 29.8 pg (ref 26.0–34.0)
MCHC: 33.4 g/dL (ref 32.0–36.0)
MCV: 89.4 fL (ref 80.0–100.0)
PLATELETS: 150 10*3/uL (ref 150–440)
RBC: 4.52 MIL/uL (ref 4.40–5.90)
RDW: 13.4 % (ref 11.5–14.5)
WBC: 11.1 10*3/uL — AB (ref 3.8–10.6)

## 2015-11-02 LAB — TROPONIN I
TROPONIN I: 0.25 ng/mL — AB (ref <0.031)
Troponin I: 0.32 ng/mL — ABNORMAL HIGH (ref <0.031)

## 2015-11-02 NOTE — Progress Notes (Signed)
Inpatient Diabetes Program Recommendations  AACE/ADA: New Consensus Statement on Inpatient Glycemic Control (2015)  Target Ranges:  Prepandial:   less than 140 mg/dL      Peak postprandial:   less than 180 mg/dL (1-2 hours)      Critically ill patients:  140 - 180 mg/dL   Review of Glycemic Control  Results for [facility], Dani (MRN [id number]) as of 11/02/2015 10:59  Ref. Range 11/01/2015 22:09 11/02/2015 07:44  Glucose-Capillary Latest Ref Range: 65-99 mg/dL 413155 (H) 244141 (H)    Diabetes history: Type 2 Outpatient Diabetes medications: none Current orders for Inpatient glycemic control: none  Inpatient Diabetes Program Recommendations:  A1C 7.3%.  No diabetes medications ordered at this time- agree with current orders. Will follow.  Susette RacerJulie Kasara Schomer, RN, BA, MHA, CDE Diabetes Coordinator Inpatient Diabetes Program  [phone] (Team Pager) [phone] [facility] Office) 11/02/2015 11:01 AM

## 2015-11-02 NOTE — Care Management Obs Status (Signed)
MEDICARE OBSERVATION STATUS NOTIFICATION   Patient Details  Name: Edward Kim MRN: [id number] Date of Birth: 06/14/1922   Medicare Observation Status Notification Given:  Yes    Marily MemosLisa M Beatrice Ziehm, RN 11/02/2015, 2:03 PM

## 2015-11-02 NOTE — Consult Note (Signed)
Cardiology Consultation Note  Patient ID: Edward Kim, MRN: [id number], DOB/AGE: 1922-07-22 82 y.o. Admit date: 11/01/2015   Date of Consult: 11/02/2015 Primary Physician: No primary care provider on file. Primary Cardiologist: New to [facility]  Chief Complaint: Weakness, diagnosed with influenza A Reason for Consult: Elevated troponin  HPI: 82 y.o. 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 Troponin was checked and found to be mildly elevated and flat trending. Cardiology is consulted.  Patient has no previously known cardiac history. He was recently diagnosed with influenza A on 3/20 and started on Tamiflu. He continued to feel weak and was brought to [facility]. No known chest pain and worsening dyspnea. Upon the patient's arrival to [facility] they were found to have troponin of 0.25-->0.32-->0.25, SCr 1.32-->1.05, WBC 11.8-->12.5-->11.1, UA with TNTC RBC. ECG showed NSR, 80 bpm, nonspecific st/t changes, CXR showed no active disease. The patient was placed on Tamiflu and Levaquin. Urine and sputum cultures are pending. Abdominal ultrasound of the RUQ was negative. Echo is pending.   Past Medical History  Diagnosis Date  . Dementia   . CKD (chronic kidney disease), stage III   . HTN (hypertension)   . HLD (hyperlipidemia)   . Anemia       Most Recent Cardiac Studies: None.   Surgical History: History reviewed. No pertinent past surgical history.   Home Meds: Prior to Admission medications   Medication Sig Start Date End Date Taking? Authorizing Provider  aspirin EC 81 MG tablet Take 81 mg by mouth daily.   Yes Historical Provider, MD  donepezil (ARICEPT) 5 MG tablet Take 5 mg by mouth at bedtime.   Yes Historical Provider, MD  lovastatin (MEVACOR) 40 MG tablet Take 80 mg by mouth at bedtime.   Yes Historical Provider, MD  oseltamivir (TAMIFLU) 75 MG capsule  Take 75 mg by mouth 2 (two) times daily. 10/30/15 11/04/15 Yes Historical Provider, MD  pantoprazole (PROTONIX) 40 MG tablet Take 40 mg by mouth daily.   Yes Historical Provider, MD  quinapril (ACCUPRIL) 10 MG tablet Take 10 mg by mouth daily.   Yes Historical Provider, MD    Inpatient Medications:  . aspirin EC  81 mg Oral Daily  . donepezil  5 mg Oral QHS  . heparin  5,000 Units Subcutaneous 3 times per day  . levofloxacin (LEVAQUIN) IV  250 mg Intravenous Q24H  . lisinopril  10 mg Oral Daily  . metoprolol tartrate  25 mg Oral BID  . oseltamivir  30 mg Oral BID  . pantoprazole  40 mg Oral Daily  . pravastatin  40 mg Oral q1800  . sodium chloride flush  3 mL Intravenous Q12H  . sodium chloride flush  3 mL Intravenous Q12H      Allergies: No Known Allergies  Social History   Social History  . Marital Status: Married    Spouse Name: N/A  . Number of Children: N/A  . Years of Education: N/A   Occupational History  . Not on file.   Social History Main Topics  . Smoking status: Never Smoker   . Smokeless tobacco: Not on file  . Alcohol Use: Not on file  . Drug Use: Not on file  . Sexual Activity: Not on file   Other Topics Concern  . Not on file   Social History Narrative     Family History  Problem Relation Age of Onset  . Family history unknown: Yes    Patient's dementia  Review of Systems: Review of Systems  Unable to perform ROS: dementia    Labs:  Recent Labs  11/01/15 1301 11/01/15 1753 11/01/15 2331 11/02/15 0632  TROPONINI 0.20* 0.25* 0.32* 0.25*   Lab Results  Component Value Date   WBC 11.1* 11/02/2015   HGB 13.5 11/02/2015   HCT 40.4 11/02/2015   MCV 89.4 11/02/2015   PLT 150 11/02/2015    Recent Labs Lab 11/01/15 1301  11/02/15 0632  NA 137  --  138  K 4.2  --  3.7  CL 106  --  102  CO2 21*  --  29  BUN 27*  --  22*  CREATININE 1.32*  < > 1.05  CALCIUM 8.1*  --  8.6*  PROT 7.0  --   --   BILITOT 0.6  --   --   ALKPHOS 40   --   --   ALT 38  --   --   AST 152*  --   --   GLUCOSE 286*  --  126*  < > = values in this interval not displayed. No results found for: CHOL, HDL, LDLCALC, TRIG No results found for: DDIMER  Radiology/Studies:  Dg Chest 2 View  11/01/2015  CLINICAL DATA:  Patient diagnosed with flu. Weak and unable to walk. Dementia. EXAM: CHEST  2 VIEW COMPARISON:  10/13/2007. FINDINGS: Cardiomegaly. Otherwise unremarkable mediastinal contours. Calcification of the thoracic aorta. No infiltrates or failure. No effusion or pneumothorax. No thoracic compression deformity or osseous destructive lesion. IMPRESSION: Cardiomegaly.  No active disease. Electronically Signed   By: Elsie StainJohn T Curnes M.D.   On: 11/01/2015 14:36   [place] Venous Img Lower Unilateral Left  11/01/2015  CLINICAL DATA:  Lower extremity edema EXAM: LEFT LOWER EXTREMITY VENOUS DOPPLER ULTRASOUND TECHNIQUE: Gray-scale sonography with graded compression, as well as color Doppler and duplex ultrasound were performed to evaluate the lower extremity deep venous systems from the level of the common femoral vein and including the common femoral, femoral, profunda femoral, popliteal and calf veins including the posterior tibial, peroneal and gastrocnemius veins when visible. The superficial great saphenous vein was also interrogated. Spectral Doppler was utilized to evaluate flow at rest and with distal augmentation maneuvers in the common femoral, femoral and popliteal veins. COMPARISON:  None. FINDINGS: Contralateral Common Femoral Vein: Respiratory phasicity is normal and symmetric with the symptomatic side. No evidence of thrombus. Normal compressibility. Common Femoral Vein: No evidence of thrombus. Normal compressibility, respiratory phasicity and response to augmentation. Saphenofemoral Junction: No evidence of thrombus. Normal compressibility and flow on color Doppler imaging. Profunda Femoral Vein: No evidence of thrombus. Normal compressibility and flow on  color Doppler imaging. Femoral Vein: No evidence of thrombus. Normal compressibility, respiratory phasicity and response to augmentation. Popliteal Vein: No evidence of thrombus. Normal compressibility, respiratory phasicity and response to augmentation. Calf Veins: No evidence of thrombus. Normal compressibility and flow on color Doppler imaging. Superficial Great Saphenous Vein: No evidence of thrombus. Normal compressibility and flow on color Doppler imaging. Venous Reflux:  None. Other Findings:  None. IMPRESSION: No evidence of deep venous thrombosis. Electronically Signed   By: Elige KoHetal  Patel   On: 11/01/2015 13:55   [place] Abdomen Limited Ruq  11/02/2015  CLINICAL DATA:  Elevated transaminase level. EXAM: US ABDOMEN LIMITED - RIGHT UPPER QUADRANT COMPARISON:  None. FINDINGS: Gallbladder: No gallstones or wall thickening visualized. No sonographic Murphy sign noted  by sonographer. Common bile duct: Diameter: 7.5 mm. Liver: Mild hepatic steatosis without focal mass. Suboptimal visualization of the left lobe due to patient body habitus. IMPRESSION: No acute hepatobiliary disease. Electronically Signed   By: Elberta Fortis M.D.   On: 11/02/2015 11:02    EKG: NSR, 80 bpm, nonspecific st/t changes  Weights: Filed Weights   11/01/15 1230 11/01/15 1813  Weight: 160 lb (72.576 kg) 183 lb 12.8 oz (83.371 kg)     Physical Exam: Blood pressure 150/60, pulse 71, temperature 97.8 F (36.6 C), temperature source Oral, resp. rate 18, height  (1.753 m), weight 183 lb 12.8 oz (83.371 kg), SpO2 99 %. Body mass index is 27.13 kg/(m^2). General: Well developed, well nourished, in no acute distress. Head: Normocephalic, atraumatic, sclera non-icteric, no xanthomas, nares are without discharge.  Neck: Negative for carotid bruits. JVD not elevated. Lungs: Clear bilaterally to auscultation without wheezes, rales, or rhonchi. Breathing is unlabored. Heart: RRR with S1 S2. II/VI systolic murmurs, no rubs, or gallops  appreciated. Abdomen: Soft, non-tender, non-distended with normoactive bowel sounds. No hepatomegaly. No rebound/guarding. No obvious abdominal masses. Msk:  Strength and tone appear normal for age. Extremities: No clubbing or cyanosis. Trace non-pitting edema along lower extremities to the mid shins.  Distal pedal pulses are 2+ and equal bilaterally. Neuro: Alert and oriented X 3. No facial asymmetry. No focal deficit. Moves all extremities spontaneously. Psych:  Responds to questions appropriately with a normal affect.    Assessment and Plan:   1. Elevated troponin: -Mildly elevated and flat trending likely supply demand ischemia in the setting of the patient's underlying influenza A and CKD stage III -No angina -Check echo to evaluate LV systolic function and wall motion  2. Influenza A: -Likely leading to the above -On Tamiflu per IM  3. Leukocytosis: -Negative UA and CXR -On Levaquin per IM  4. HTN: -Reasonably controlled in the setting of his infection -Monitor  Signed, Eula Listen, PA-C Pager: [phone] 11/02/2015, 11:13 AM

## 2015-11-02 NOTE — Progress Notes (Signed)
PT Cancellation Note  Patient Details Name: Edward Kim MRN: [id number] DOB: 04/22/1922   Cancelled Treatment:    Reason Eval/Treat Not Completed: Medical issues which prohibited therapy. Pt's chart reviewed. Pt currently has elevated troponin of 0.25 and has a pending cardiology consult. PT will wait until cardiology consult is complete and f/u to complete evaluation at a later time.   Adelene IdlerMindy Jo Olinda Nola, PT, DPT  11/02/2015, 8:45 AM [phone]

## 2015-11-02 NOTE — Evaluation (Signed)
Physical Therapy Evaluation Patient Details Name: Edward Kim MRN: [id number] DOB: 1922-04-04 Today's Date: 11/02/2015   History of Present Illness  82 yo M presented to ED for weakness and unable to walk found to have influenza, dehydration, renal failure and elevated troponin likely due to demand ischemia. PMH includes dementia, DM, CKD III, HTN, urine incontinence, and alcohol abuse.   Clinical Impression  Pt demonstrated generalized weakness and difficulty walking due to recent illness. He is a poor historian and information he provides is unclear of accuracy. Requires modA with HOB elevated for bed mobility. Transfers and ambulation required FWW and +2 minA to modA with constant cues for safety and technique. STR is recommended due to poor balance and functional mobility to progress towards PLOF. Pt will benefit from skilled PT services to increase functional I and mobility for safe discharge.     Follow Up Recommendations SNF    Equipment Recommendations  Rolling walker with 5" wheels (pt states he doesn't have one)   Recommendations for Other Services       Precautions / Restrictions Precautions Precautions: Fall Restrictions Weight Bearing Restrictions: No      Mobility  Bed Mobility Overal bed mobility: Needs Assistance;+2 for physical assistance Bed Mobility: Supine to Sit;Sit to Supine     Supine to sit: Mod assist;HOB elevated Sit to supine: Mod assist;HOB elevated   General bed mobility comments: uses rail; difficulty getting trunk upright  Transfers Overall transfer level: Needs assistance Equipment used: Rolling walker (2 wheeled) Transfers: Sit to/from UGI CorporationStand;Stand Pivot Transfers Sit to Stand: Mod assist;+2 physical assistance Stand pivot transfers: Mod assist;+2 physical assistance       General transfer comment: constant cues for hand placement; staying inside FWW and staying upright  Ambulation/Gait Ambulation/Gait assistance: Min assist;+2  physical assistance Ambulation Distance (Feet): 15 Feet Assistive device: Rolling walker (2 wheeled) Gait Pattern/deviations: Step-to pattern;Decreased stride length;Shuffle;Leaning posteriorly;Trunk flexed;Narrow base of support     General Gait Details: Demonstrates unsteady gait requiring frequent cues for staying inside FWW, increasing BOS, step length and postural correction for improved balance.  Stairs            Wheelchair Mobility    Modified Rankin (Stroke Patients Only)       Balance Overall balance assessment: Needs assistance Sitting-balance support: Bilateral upper extremity supported;Feet supported Sitting balance-Leahy Scale: Poor Sitting balance - Comments: poor posture, unsteady and unable to maintain independently Postural control: Posterior lean Standing balance support: Bilateral upper extremity supported Standing balance-Leahy Scale: Poor Standing balance comment: unsteady; +2 assistance                             Pertinent Vitals/Pain Pain Assessment: No/denies pain    Home Living Family/patient expects to be discharged to:: Private residence Living Arrangements: Spouse/significant other;Children Available Help at Discharge: Other (Comment) (Pt is part of the [facility] program.) Type of Home: House           Additional Comments: Pt is a poor historian and has history of dementia and information is unclear of accuracy/unable to provide PLOF and home environment.    Prior Function Level of Independence: Independent (unsure of accuracy of pt's information/poor historian)         Comments: Part of [facility] program     Hand Dominance        Extremity/Trunk Assessment   Upper Extremity Assessment: Generalized weakness           Lower Extremity  Assessment: Generalized weakness         Communication   Communication: No difficulties  Cognition Arousal/Alertness: Awake/alert Behavior During Therapy: WFL for tasks  assessed/performed Overall Cognitive Status: History of cognitive impairments - at baseline                      General Comments      Exercises Other Exercises Other Exercises: B LE therex seated: marching, LAQs, long sitting: ankle pumps, heel slides, and hip abd slides with AAROM x10 each. Cues for proper technique.      Assessment/Plan    PT Assessment Patient needs continued PT services  PT Diagnosis Difficulty walking;Generalized weakness   PT Problem List Decreased strength;Decreased activity tolerance;Decreased balance;Decreased mobility;Decreased cognition;Decreased knowledge of use of DME;Decreased safety awareness  PT Treatment Interventions DME instruction;Gait training;Stair training;Therapeutic activities;Therapeutic exercise;Balance training;Neuromuscular re-education;Patient/family education   PT Goals (Current goals can be found in the Care Plan section) Acute Rehab PT Goals Patient Stated Goal: "I need to get out of here" PT Goal Formulation: With patient Time For Goal Achievement: 11/16/15 Potential to Achieve Goals: Fair    Frequency Min 2X/week   Barriers to discharge Inaccessible home environment;Decreased caregiver support will need 24 hour care at this time    Co-evaluation               End of Session Equipment Utilized During Treatment: Gait belt Activity Tolerance: Patient tolerated treatment well Patient left: in chair;with call bell/phone within reach;with chair alarm set Nurse Communication: Mobility status    Functional Assessment Tool Used: Clinical judgement; STS Functional Limitation: Mobility: Walking and moving around Mobility: Walking and Moving Around Current Status [phone]): At least 40 percent but less than 60 percent impaired, limited or restricted Mobility: Walking and Moving Around Goal Status [phone]): At least 1 percent but less than 20 percent impaired, limited or restricted    Time: 1410-1436 PT Time  Calculation (min) (ACUTE ONLY): 26 min   Charges:   PT Evaluation $PT Eval Moderate Complexity: 1 Procedure PT Treatments $Therapeutic Exercise: 8-22 mins   PT G Codes:   PT G-Codes **NOT FOR INPATIENT CLASS** Functional Assessment Tool Used: Clinical judgement; STS Functional Limitation: Mobility: Walking and moving around Mobility: Walking and Moving Around Current Status (H0865): At least 40 percent but less than 60 percent impaired, limited or restricted Mobility: Walking and Moving Around Goal Status [phone]): At least 1 percent but less than 20 percent impaired, limited or restricted    Adelene Idler, PT, DPT  11/02/2015, 3:08 PM [phone]

## 2015-11-02 NOTE — Progress Notes (Addendum)
Patient ID: Edward Kim, male   DOB: 03-09-1922, 82 y.o.   MRN: [id number] [facility] PROGRESS NOTE  Horald Birky WUJ:[id number] DOB: [birth year] DOA: 11/01/2015 PCP: No primary care provider on file.  HPI/Subjective: Patient feels okay and offers no complaints. Patient does have dementia and is not the best historian. Niece at the bedside and very concerned that nobody can take care of him at home. The patient was unable to lift his legs up off the bed for me.  Objective: Filed Vitals:   11/02/15 1144 11/02/15 1300  BP: 127/62   Pulse: 74 86  Temp: 97.6 F (36.4 C)   Resp: 18     Filed Weights   11/01/15 1230 11/01/15 1813  Weight: 72.576 kg (160 lb) 83.371 kg (183 lb 12.8 oz)    ROS: Review of Systems  Constitutional: Negative for fever and chills.  Eyes: Negative for blurred vision.  Respiratory: Negative for cough and shortness of breath.   Cardiovascular: Negative for chest pain.  Gastrointestinal: Negative for nausea, vomiting, abdominal pain, diarrhea and constipation.  Genitourinary: Negative for dysuria.  Musculoskeletal: Negative for joint pain.  Neurological: Negative for dizziness and headaches.   Exam: Physical Exam  HENT:  Nose: No mucosal edema.  Mouth/Throat: No oropharyngeal exudate or posterior oropharyngeal edema.  Eyes: Conjunctivae, EOM and lids are normal. Pupils are equal, round, and reactive to light.  Neck: No JVD present. Carotid bruit is not present. No edema present. No thyroid mass and no thyromegaly present.  Cardiovascular: Regular rhythm, S1 normal and S2 normal.  Exam reveals no gallop.   Murmur heard.  Systolic murmur is present with a grade of 2/6  Pulses:      Dorsalis pedis pulses are 2+ on the right side, and 2+ on the left side.  Respiratory: No respiratory distress. He has no wheezes. He has no rhonchi. He has no rales.  GI: Soft. Bowel sounds are normal. There is no tenderness.  Musculoskeletal:       Right ankle:  He exhibits swelling.       Left ankle: He exhibits swelling.  Lymphadenopathy:    He has no cervical adenopathy.  Neurological: He is alert.  Patient unable to get his legs up off the bed. When I did passive range of motion he was unable to hold his legs in the air. He was able to move his arms without a problem.  Skin: Skin is warm. No rash noted. Nails show no clubbing.  Psychiatric: He has a normal mood and affect.      Data Reviewed: Basic Metabolic Panel:  Recent Labs Lab 11/01/15 1301 11/01/15 1753 11/02/15 0632  NA 137  --  138  K 4.2  --  3.7  CL 106  --  102  CO2 21*  --  29  GLUCOSE 286*  --  126*  BUN 27*  --  22*  CREATININE 1.32* 1.19 1.05  CALCIUM 8.1*  --  8.6*  MG  --  2.2  --    Liver Function Tests:  Recent Labs Lab 11/01/15 1301  AST 152*  ALT 38  ALKPHOS 40  BILITOT 0.6  PROT 7.0  ALBUMIN 3.6   CBC:  Recent Labs Lab 11/01/15 1301 11/01/15 1753 11/02/15 0632  WBC 11.8* 12.5* 11.1*  NEUTROABS 10.5*  --   --   HGB 13.0 13.0 13.5  HCT 39.1* 39.9* 40.4  MCV 88.8 90.6 89.4  PLT 143* 146* 150   Cardiac Enzymes:  Recent  Labs Lab 11/01/15 1301 11/01/15 1753 11/01/15 2331 11/02/15 0632  TROPONINI 0.20* 0.25* 0.32* 0.25*    CBG:  Recent Labs Lab 11/01/15 2209 11/02/15 0744 11/02/15 1142  GLUCAP 155* 141* 163*    Recent Results (from the past 240 hour(s))  Urine culture     Status: None (Preliminary result)   Collection Time: 11/01/15  1:01 PM  Result Value Ref Range Status   Specimen Description URINE, CATHETERIZED  Final   Special Requests NONE  Final   Culture NO GROWTH < 12 HOURS  Final   Report Status PENDING  Incomplete     Studies: Dg Chest 2 View  11/01/2015  CLINICAL DATA:  Patient diagnosed with flu. Weak and unable to walk. Dementia. EXAM: CHEST  2 VIEW COMPARISON:  10/13/2007. FINDINGS: Cardiomegaly. Otherwise unremarkable mediastinal contours. Calcification of the thoracic aorta. No infiltrates or failure.  No effusion or pneumothorax. No thoracic compression deformity or osseous destructive lesion. IMPRESSION: Cardiomegaly.  No active disease. Electronically Signed   By: Elsie StainJohn T Curnes M.D.   On: 11/01/2015 14:36   [place] Venous Img Lower Unilateral Left  11/01/2015  CLINICAL DATA:  Lower extremity edema EXAM: LEFT LOWER EXTREMITY VENOUS DOPPLER ULTRASOUND TECHNIQUE: Gray-scale sonography with graded compression, as well as color Doppler and duplex ultrasound were performed to evaluate the lower extremity deep venous systems from the level of the common femoral vein and including the common femoral, femoral, profunda femoral, popliteal and calf veins including the posterior tibial, peroneal and gastrocnemius veins when visible. The superficial great saphenous vein was also interrogated. Spectral Doppler was utilized to evaluate flow at rest and with distal augmentation maneuvers in the common femoral, femoral and popliteal veins. COMPARISON:  None. FINDINGS: Contralateral Common Femoral Vein: Respiratory phasicity is normal and symmetric with the symptomatic side. No evidence of thrombus. Normal compressibility. Common Femoral Vein: No evidence of thrombus. Normal compressibility, respiratory phasicity and response to augmentation. Saphenofemoral Junction: No evidence of thrombus. Normal compressibility and flow on color Doppler imaging. Profunda Femoral Vein: No evidence of thrombus. Normal compressibility and flow on color Doppler imaging. Femoral Vein: No evidence of thrombus. Normal compressibility, respiratory phasicity and response to augmentation. Popliteal Vein: No evidence of thrombus. Normal compressibility, respiratory phasicity and response to augmentation. Calf Veins: No evidence of thrombus. Normal compressibility and flow on color Doppler imaging. Superficial Great Saphenous Vein: No evidence of thrombus. Normal compressibility and flow on color Doppler imaging. Venous Reflux:  None. Other Findings:   None. IMPRESSION: No evidence of deep venous thrombosis. Electronically Signed   By: Elige KoHetal  Patel   On: 11/01/2015 13:55   [place] Abdomen Limited Ruq  11/02/2015  CLINICAL DATA:  Elevated transaminase level. EXAM: US ABDOMEN LIMITED - RIGHT UPPER QUADRANT COMPARISON:  None. FINDINGS: Gallbladder: No gallstones or wall thickening visualized. No sonographic Murphy sign noted by sonographer. Common bile duct: Diameter: 7.5 mm. Liver: Mild hepatic steatosis without focal mass. Suboptimal visualization of the left lobe due to patient body habitus. IMPRESSION: No acute hepatobiliary disease. Electronically Signed   By: Elberta Fortisaniel  Boyle M.D.   On: 11/02/2015 11:02    Scheduled Meds: . aspirin EC  81 mg Oral Daily  . donepezil  5 mg Oral QHS  . heparin  5,000 Units Subcutaneous 3 times per day  . levofloxacin (LEVAQUIN) IV  250 mg Intravenous Q24H  . lisinopril  10 mg Oral Daily  . metoprolol tartrate  25 mg Oral BID  . oseltamivir  30 mg Oral BID  . pantoprazole  40 mg Oral Daily  . pravastatin  40 mg Oral q1800  . sodium chloride flush  3 mL Intravenous Q12H  . sodium chloride flush  3 mL Intravenous Q12H    Assessment/Plan:  1. Lower extremity weakness. Physical therapy recommended rehabilitation. I was unable to get the patient to lift his lower extremities up off the bed. The patient did have some pain when I did some passive range of motion. I will get a pelvis x-ray make sure he doesn't have a fracture. 2. Influenza positive. Continue Tamiflu. DC Levaquin. 3. Dementia with behavioral disturbance. Patient has protective mittens on. Continue Aricept. 4. Hyperlipidemia unspecified continue pravastatin 5. Essential hypertension continue lisinopril and metoprolol 6. Gastroesophageal reflux disease without esophagitis continue Protonix 7. Elevated troponin likely demand ischemia from influenza positive 8. Type 2 diabetes diet controlled  Code Status:     Code Status Orders        Start      Ordered   11/01/15 1748  Full code   Continuous     11/01/15 1747    Code Status History    Date Active Date Inactive Code Status Order ID Comments User Context   This patient has a current code status but no historical code status.     Family Communication: The niece at bedside Disposition Plan: Physical therapy recommended rehabilitation   Antibiotics:  Tamiflu  Time spent: 25 minutes  Alford Highland  [facility]

## 2015-11-02 NOTE — Clinical Social Work Note (Signed)
Clinical Social Work Assessment  Patient Details  Name: Edward Kim MRN: [id number] Date of Birth: [birth year]  Date of referral:  11/02/15               Reason for consult:  Discharge Planning                Permission sought to share information with:    Permission granted to share information::     Name::        Agency::     Relationship::   Lowell Guitar(Ometta Laurence Alyorbett [phone])  Contact Information:     Housing/Transportation Living arrangements for the past 2 months:  Single Family Home Source of Information:  Other (Comment Required) ([facility] ) Patient Interpreter Needed:  None Criminal Activity/Legal Involvement Pertinent to Current Situation/Hospitalization:  No - Comment as needed Significant Relationships:  Adult Children, Other Family Members Lives with:  Adult Children Do you feel safe going back to the place where you live?  Yes Need for family participation in patient care:  Yes (Comment) Lowell Guitar(Ometta Laurence AlyCorbett [phone])  Care giving concerns:  Patient is from home with son. He is not able to get up from the bed.    Social Worker assessment / plan:  CSW was consulted by PT stating that patient would benefit from SNF placement at discharge. CSW contacted [facility]- Jae Direebbie Johnson and discussed discharge options for patient. She reports that patient would benefit from SNF placement. Requested CSW to [facility]. Informed CSW that Oretha CapriceOmetta Corbett is the best contact for patient. CSW contacted Oretha CapriceOmetta Corbett at [phone]. Left voicemail. Awaiting phone call back.  FL2/ PASRR completed and [facility]. Awaiting bed offer. CSW will continue to follow and assist.   Employment status:    Insurance information:  Medicare, [place] PT Recommendations:  Skilled Nursing Facility Information / Referral to community resources:  Skilled Nursing Facility  Patient/Family's Response to care:  Unable to access at this time. CSW is  awaiting a phone call back [facility]  Patient/Family's Understanding of and Emotional Response to Diagnosis, Current Treatment, and Prognosis: Unable to access at this time. CSW is awaiting a phone call back from Wells Fargometta Corbett  Emotional Assessment Appearance:  Appears stated age Attitude/Demeanor/Rapport:   (None) Affect (typically observed):  Calm, Pleasant Orientation:  Oriented to Self, Oriented to Place Alcohol / Substance use:  Not Applicable Psych involvement (Current and /or in the community):  No (Comment)  Discharge Needs  Concerns to be addressed:  Discharge Planning Concerns Readmission within the last 30 days:  No Current discharge risk:  Chronically ill Barriers to Discharge:  Continued Medical Work up   WalgreenChristina E Amand Lemoine, LCSW 11/02/2015, 3:03 PM

## 2015-11-02 NOTE — Progress Notes (Signed)
*  PRELIMINARY RESULTS* Echocardiogram 2D Echocardiogram has been performed.  Georgann HousekeeperJerry R Hege 11/02/2015, 11:01 AM

## 2015-11-02 NOTE — [place] FL2 (Signed)
[place] MEDICAID FL2 LEVEL OF CARE SCREENING TOOL     IDENTIFICATION  Patient Name: Edward PortsRainey Agustin Birthdate: 10-09-1921 Sex: male Admission Date (Current Location): 11/01/2015  [facility] [place]  [place]  ([id number]) Facility and Address:  [facility], [address], [place], [place] [address]      Provider Number: [id number]  Attending Physician Name and Address:  Alford Highlandichard Wieting, MD  Relative Name and Phone Number:       Current Level of Care: Hospital Recommended Level of Care: Skilled Nursing Facility Prior Approval Number:    Date Approved/Denied:   PASRR Number:  [id number])  Discharge Plan: SNF    Current Diagnoses: Patient Active Problem List   Diagnosis Date Noted  . Influenza 11/01/2015  . Generalized weakness 11/01/2015  . CKD (chronic kidney disease), stage III 11/01/2015  . Diabetes mellitus (HCC) 11/01/2015  . Elevated transaminase level 11/01/2015  . Elevated troponin 11/01/2015    Orientation RESPIRATION BLADDER Height & Weight     Self, Place  Normal Incontinent Weight: 183 lb 12.8 oz (83.371 kg) Height:  5\' 9"  (175.3 cm)  BEHAVIORAL SYMPTOMS/MOOD NEUROLOGICAL BOWEL NUTRITION STATUS   (None)  (None) Incontinent Diet (Carb Modified )  AMBULATORY STATUS COMMUNICATION OF NEEDS Skin   Extensive Assist Verbally Normal                       Personal Care Assistance Level of Assistance  Bathing, Feeding, Dressing Bathing Assistance: Limited assistance Feeding assistance: Independent Dressing Assistance: Limited assistance     Functional Limitations Info  Sight, Hearing, Speech Sight Info: Adequate Hearing Info: Adequate Speech Info: Adequate    SPECIAL CARE FACTORS FREQUENCY  PT (By licensed PT)     PT Frequency:  (5)              Contractures      Additional Factors Info  Code Status, Allergies, Isolation Precautions Code Status Info:  (Full Code) Allergies Info:  (No Known  Allergies )     Isolation Precautions Info:  (Droplet precaution)     Current Medications (11/02/2015):  This is the current hospital active medication list Current Facility-Administered Medications  Medication Dose Route Frequency Provider Last Rate Last Dose  . 0.9 %  sodium chloride infusion  250 mL Intravenous PRN Katharina Caperima Vaickute, MD      . acetaminophen (TYLENOL) tablet 650 mg  650 mg Oral Q6H PRN Katharina Caperima Vaickute, MD       Or  . acetaminophen (TYLENOL) suppository 650 mg  650 mg Rectal Q6H PRN Katharina Caperima Vaickute, MD      . aspirin EC tablet 81 mg  81 mg Oral Daily Katharina Caperima Vaickute, MD   81 mg at 11/02/15 1142  . donepezil (ARICEPT) tablet 5 mg  5 mg Oral QHS Katharina Caperima Vaickute, MD   5 mg at 11/01/15 2223  . haloperidol lactate (HALDOL) injection 1 mg  1 mg Intravenous Q6H PRN Katharina Caperima Vaickute, MD      . heparin injection 5,000 Units  5,000 Units Subcutaneous 3 times per day Katharina Caperima Vaickute, MD   5,000 Units at 11/02/15 0655  . Levofloxacin (LEVAQUIN) IVPB 250 mg  250 mg Intravenous Q24H Cindi CarbonMary M Swayne, RPH   250 mg at 11/02/15 1141  . lisinopril (PRINIVIL,ZESTRIL) tablet 10 mg  10 mg Oral Daily Katharina Caperima Vaickute, MD   10 mg at 11/02/15 1144  . metoprolol tartrate (LOPRESSOR) tablet 25 mg  25 mg Oral BID Katharina Caperima Vaickute, MD  25 mg at 11/02/15 1144  . ondansetron (ZOFRAN) tablet 4 mg  4 mg Oral Q6H PRN Katharina Caper, MD       Or  . ondansetron (ZOFRAN) injection 4 mg  4 mg Intravenous Q6H PRN Katharina Caper, MD      . oseltamivir (TAMIFLU) capsule 30 mg  30 mg Oral BID Cindi Carbon, RPH   30 mg at 11/02/15 1142  . pantoprazole (PROTONIX) EC tablet 40 mg  40 mg Oral Daily Katharina Caper, MD   40 mg at 11/02/15 1142  . pravastatin (PRAVACHOL) tablet 40 mg  40 mg Oral q1800 Katharina Caper, MD   40 mg at 11/01/15 1817  . sodium chloride flush (NS) 0.9 % injection 3 mL  3 mL Intravenous Q12H Katharina Caper, MD   3 mL at 11/02/15 1143  . sodium chloride flush (NS) 0.9 % injection 3 mL  3 mL Intravenous Q12H Katharina Caper,  MD   3 mL at 11/01/15 2222  . sodium chloride flush (NS) 0.9 % injection 3 mL  3 mL Intravenous PRN Katharina Caper, MD         Discharge Medications: Please see discharge summary for a list of discharge medications.  Relevant Imaging Results:  Relevant Lab Results:   Additional Information  (SSN [id number])  Verta Ellen Tasheka Houseman, LCSW

## 2015-11-03 LAB — URINE CULTURE: CULTURE: NO GROWTH

## 2015-11-03 LAB — GLUCOSE, CAPILLARY
Glucose-Capillary: 174 mg/dL — ABNORMAL HIGH (ref 65–99)
Glucose-Capillary: 136 mg/dL — ABNORMAL HIGH (ref 65–99)

## 2015-11-03 MED ORDER — METOPROLOL TARTRATE 25 MG PO TABS: 25.0000 mg | ORAL_TABLET | Freq: Two times a day (BID) | ORAL | Status: DC

## 2015-11-03 MED ORDER — OSELTAMIVIR PHOSPHATE 30 MG PO CAPS: ORAL_CAPSULE | ORAL | Status: DC

## 2015-11-03 NOTE — Progress Notes (Signed)
Patient discharge instructions and prescription faxed [facility] by Woods Holehristina, LCSW. IV was removed with no signs of infection. Tele box removed.  Dressing clean, dry intact. No skin tears or wounds present. Patient was escorted out with [facility] staff member via wheelchair via private auto. No further needs from care management team.

## 2015-11-03 NOTE — Progress Notes (Signed)
CSW was informed by MD that patient would be ready to [facility] today [facility]. CSW contacted Gavin PoundDeborah in admissions with [facility]. Per Gavin Poundeborah she wanted her director of nursing to determine if patient could come to day due to patient having the flu. Reports that patient needs to be treated for the flu for 3 days. CSW informed Gavin PoundDeborah that patient began receiving Tamiflu on 11/01/15 which meets their 3 day rule. CSW faxed patient's current medication list and circled Tamiflu start date to verify above. Per Gavin Poundeborah she's able to accept patient today. CSW spoke to MD and informed him of above. CSW will continue to follow and assist.  Woodroe Modehristina Volanda Mangine, MSW, LCSW-A Clinical Social Work Department [phone]

## 2015-11-03 NOTE — Progress Notes (Signed)
Clinical Social Worker was informed that patient will be medically ready to discharge [facility]. Patient and his niece Lowell GuitarOmetta are in a agreement with plan. CSW called Gavin Poundeborah- Admissions Coordinator with [facility] to confirm that patient's bed is ready. Provided patient's room number 319 and number to call for report [phone] . All [facility] via [facility]. Rx's added to discharge packet.   RN will call report and patient will [facility].   Woodroe Modehristina Osaze Hubbert, MSW, LCSW-A Clinical Social Work Department [phone]

## 2015-11-03 NOTE — Progress Notes (Signed)
Per Dr. Renae GlossWieting okay to d/c levaquin IVPB.

## 2015-11-03 NOTE — Clinical Social Work Placement (Signed)
   CLINICAL SOCIAL WORK PLACEMENT  NOTE  Date:  11/03/2015  Patient Details  Name: Edward Kim MRN: [id number] Date of Birth: 1921-08-23  Clinical Social Work is seeking post-discharge placement for this patient at the Skilled  Nursing Facility level of care (*CSW will initial, date and re-position this form in  chart as items are completed):  No   Patient/family provided with [facility] Clinical Social Work Department's list of facilities offering this level of care within the geographic area requested by the patient (or if unable, by the patient's family).  No   Patient/family informed of their freedom to choose among providers that offer the needed level of care, that participate in Medicare, Medicaid or managed care program needed by the patient, have an available bed and are willing to accept the patient.  No   Patient/family informed of [facility]'s ownership interest in [facility] and [facility], as well as of the fact that they are under no obligation to receive care at these facilities.  PASRR submitted to EDS on       PASRR number received on       Existing PASRR number confirmed on 11/02/15     FL2 transmitted to all facilities in geographic area requested by pt/family on 11/02/15     FL2 transmitted to all facilities within larger geographic area on       Patient informed that his/her managed care company has contracts with or will negotiate with certain facilities, including the following:        Yes   Patient/family informed of bed offers received.  Patient chooses bed at  [facility])     Physician recommends and patient chooses bed at      Patient to be transferred to  [facility]) on  .  Patient to be transferred to facility by  ([facility] transport)     Patient family notified on 11/03/15 of transfer.  Name of family member notified:   63([name])     PHYSICIAN       Additional Comment:     _______________________________________________ Idamae Lusherhristina E Aloysious Vangieson, LCSW 11/03/2015, 10:28 AM

## 2015-11-03 NOTE — Care Management (Signed)
[facility] patient. [facility]. [facility], Physician assistant in and is aware of transfer.

## 2015-11-03 NOTE — Discharge Summary (Signed)
[facility] [place] [facility]   PATIENT NAME: Edward Kim    MR#:  [id number]  DATE OF BIRTH:  1922-08-11  DATE OF ADMISSION:  11/01/2015 ADMITTING PHYSICIAN: Katharina Caper, MD  DATE OF DISCHARGE: 11/03/2015  PRIMARY CARE PHYSICIAN: [facility]   ADMISSION DIAGNOSIS:  Dehydration [E86.0] Renal failure [N19] Elevated transaminase level [R74.0]  DISCHARGE DIAGNOSIS:  Active Problems:   Influenza   Generalized weakness   CKD (chronic kidney disease), stage III   Diabetes mellitus (HCC)   Elevated transaminase level   Elevated troponin   SECONDARY DIAGNOSIS:   Past Medical History  Diagnosis Date  . Dementia   . CKD (chronic kidney disease), stage III   . HTN (hypertension)   . HLD (hyperlipidemia)   . Anemia     HOSPITAL COURSE:   1. Lower extremity weakness. Physical therapy recommended rehabilitation. Patient still unable to straight leg raise for me. But able to flex and extend his toes. Pelvis x-ray is negative. Patient has no pain in his back or anywhere. Patient has a wife with dementia at home and a son that works third shift. Patient will need to be able to walk before going home. 2. Influenza positive continue Tamiflu to completion low-dose 3. Dementia with behavioral disturbance. Continue Aricept. Patient is very calm today and feeding himself breakfast. 4. Hyperlipidemia unspecified continue pravastatin 5. Essential hypertension continue medications 6. Gastroesophageal reflux disease without esophagitis continue Protonix 7. Elevated troponin demand ischemia from being influenza positive 8. Type 2 diabetes diet controlled 9. Acute renal insufficiency on chronic kidney disease stage II  DISCHARGE CONDITIONS:   Satisfactory  CONSULTS OBTAINED:  Treatment Team:  Sondra Barges, PA-C Antonieta Iba, MD  DRUG ALLERGIES:  No Known Allergies  DISCHARGE MEDICATIONS:   Current Discharge Medication List    START taking  these medications   Details  metoprolol tartrate (LOPRESSOR) 25 MG tablet Take 1 tablet (25 mg total) by mouth 2 (two) times daily. Qty: 60 tablet, Refills: 0      CONTINUE these medications which have CHANGED   Details  oseltamivir (TAMIFLU) 30 MG capsule One tablet twice a day for three days then stop Qty: 6 capsule, Refills: 0      CONTINUE these medications which have NOT CHANGED   Details  aspirin EC 81 MG tablet Take 81 mg by mouth daily.    donepezil (ARICEPT) 5 MG tablet Take 5 mg by mouth at bedtime.    lovastatin (MEVACOR) 40 MG tablet Take 80 mg by mouth at bedtime.    pantoprazole (PROTONIX) 40 MG tablet Take 40 mg by mouth daily.    quinapril (ACCUPRIL) 10 MG tablet Take 10 mg by mouth daily.         DISCHARGE INSTRUCTIONS:   Follow-up at rehabilitation.  If you experience worsening of your admission symptoms, develop shortness of breath, life threatening emergency, suicidal or homicidal thoughts you must seek medical attention immediately by calling 911 or calling your MD immediately  if symptoms less severe.  You Must read complete instructions/literature along with all the possible adverse reactions/side effects for all the Medicines you take and that have been prescribed to you. Take any new Medicines after you have completely understood and accept all the possible adverse reactions/side effects.   Please note  You were cared for by a hospitalist during your hospital stay. If you have any questions about your discharge medications or the care you received while you were in the  hospital after you are discharged, you can call the unit and asked to speak with the hospitalist on call if the hospitalist that took care of you is not available. Once you are discharged, your primary care physician will handle any further medical issues. Please note that NO REFILLS for any discharge medications will be authorized once you are discharged, as it is imperative that you  return to your primary care physician (or establish a relationship with a primary care physician if you do not have one) for your aftercare needs so that they can reassess your need for medications and monitor your lab values.    Today   CHIEF COMPLAINT:   Chief Complaint  Patient presents with  . Weakness    HISTORY OF PRESENT ILLNESS:  Edward Kim  is a 82 y.o. male dementia presented with weakness and positive influenza   VITAL SIGNS:  Blood pressure 148/49, pulse 78, temperature 98.1 F (36.7 C), temperature source Oral, resp. rate 18, height  (1.753 m), weight 83.371 kg (183 lb 12.8 oz), SpO2 98 %.    PHYSICAL EXAMINATION:  GENERAL:  82 y.o.-year-old patient lying in the bed with no acute distress.  EYES: Pupils equal, round, reactive to light and accommodation. No scleral icterus. Extraocular muscles intact.  HEENT: Head atraumatic, normocephalic. Oropharynx and nasopharynx clear.  NECK:  Supple, no jugular venous distention. No thyroid enlargement, no tenderness.  LUNGS: Normal breath sounds bilaterally, no wheezing, rales,rhonchi or crepitation. No use of accessory muscles of respiration.  CARDIOVASCULAR: S1, S2 normal. 3 and a 6 systolic murmurs, no rubs, or gallops.  ABDOMEN: Soft, non-tender, non-distended. Bowel sounds present. No organomegaly or mass.  EXTREMITIES: Trace pedal edema, no cyanosis, or clubbing.  NEUROLOGIC: Patient unable to lift his legs up off the bed. Able to dorsiflex and extend the toes PSYCHIATRIC: The patient is alert.  SKIN: No obvious rash, lesion, or ulcer.   DATA REVIEW:   CBC  Recent Labs Lab 11/02/15 0632  WBC 11.1*  HGB 13.5  HCT 40.4  PLT 150    Chemistries   Recent Labs Lab 11/01/15 1301 11/01/15 1753 11/02/15 0632  NA 137  --  138  K 4.2  --  3.7  CL 106  --  102  CO2 21*  --  29  GLUCOSE 286*  --  126*  BUN 27*  --  22*  CREATININE 1.32* 1.19 1.05  CALCIUM 8.1*  --  8.6*  MG  --  2.2  --   AST 152*   --   --   ALT 38  --   --   ALKPHOS 40  --   --   BILITOT 0.6  --   --     Cardiac Enzymes  Recent Labs Lab 11/02/15 1610  TROPONINI 0.25*    Microbiology Results  Results for orders placed or performed during the hospital encounter of 11/01/15  Urine culture     Status: None   Collection Time: 11/01/15  1:01 PM  Result Value Ref Range Status   Specimen Description URINE, CATHETERIZED  Final   Special Requests NONE  Final   Culture NO GROWTH 2 DAYS  Final   Report Status 11/03/2015 FINAL  Final    RADIOLOGY:  Dg Chest 2 View  11/01/2015  CLINICAL DATA:  Patient diagnosed with flu. Weak and unable to walk. Dementia. EXAM: CHEST  2 VIEW COMPARISON:  10/13/2007. FINDINGS: Cardiomegaly. Otherwise unremarkable mediastinal contours. Calcification of the thoracic aorta. No infiltrates  or failure. No effusion or pneumothorax. No thoracic compression deformity or osseous destructive lesion. IMPRESSION: Cardiomegaly.  No active disease. Electronically Signed   By: Elsie Stain M.D.   On: 11/01/2015 14:36   Dg Pelvis 1-2 Views  11/02/2015  CLINICAL DATA:  Dementia.  Pain. EXAM: PELVIS - 1-2 VIEW COMPARISON:  None. FINDINGS: There are mild symmetric degenerative changes of the hips. There are degenerative changes of the spine a mild degenerate change of the sacroiliac joints. No evidence of acute fracture or dislocation. IMPRESSION: No acute findings. Electronically Signed   By: Elberta Fortis M.D.   On: 11/02/2015 17:42   US Venous Img Lower Unilateral Left  11/01/2015  CLINICAL DATA:  Lower extremity edema EXAM: LEFT LOWER EXTREMITY VENOUS DOPPLER ULTRASOUND TECHNIQUE: Gray-scale sonography with graded compression, as well as color Doppler and duplex ultrasound were performed to evaluate the lower extremity deep venous systems from the level of the common femoral vein and including the common femoral, femoral, profunda femoral, popliteal and calf veins including the posterior tibial, peroneal  and gastrocnemius veins when visible. The superficial great saphenous vein was also interrogated. Spectral Doppler was utilized to evaluate flow at rest and with distal augmentation maneuvers in the common femoral, femoral and popliteal veins. COMPARISON:  None. FINDINGS: Contralateral Common Femoral Vein: Respiratory phasicity is normal and symmetric with the symptomatic side. No evidence of thrombus. Normal compressibility. Common Femoral Vein: No evidence of thrombus. Normal compressibility, respiratory phasicity and response to augmentation. Saphenofemoral Junction: No evidence of thrombus. Normal compressibility and flow on color Doppler imaging. Profunda Femoral Vein: No evidence of thrombus. Normal compressibility and flow on color Doppler imaging. Femoral Vein: No evidence of thrombus. Normal compressibility, respiratory phasicity and response to augmentation. Popliteal Vein: No evidence of thrombus. Normal compressibility, respiratory phasicity and response to augmentation. Calf Veins: No evidence of thrombus. Normal compressibility and flow on color Doppler imaging. Superficial Great Saphenous Vein: No evidence of thrombus. Normal compressibility and flow on color Doppler imaging. Venous Reflux:  None. Other Findings:  None. IMPRESSION: No evidence of deep venous thrombosis. Electronically Signed   By: Elige Ko   On: 11/01/2015 13:55   US Abdomen Limited Ruq  11/02/2015  CLINICAL DATA:  Elevated transaminase level. EXAM: US ABDOMEN LIMITED - RIGHT UPPER QUADRANT COMPARISON:  None. FINDINGS: Gallbladder: No gallstones or wall thickening visualized. No sonographic Murphy sign noted by sonographer. Common bile duct: Diameter: 7.5 mm. Liver: Mild hepatic steatosis without focal mass. Suboptimal visualization of the left lobe due to patient body habitus. IMPRESSION: No acute hepatobiliary disease. Electronically Signed   By: Elberta Fortis M.D.   On: 11/02/2015 11:02    Management plans discussed with  the patient, family (neice) and they are in agreement.  CODE STATUS:     Code Status Orders        Start     Ordered   11/01/15 1748  Full code   Continuous     11/01/15 1747    Code Status History    Date Active Date Inactive Code Status Order ID Comments User Context   This patient has a current code status but no historical code status.      TOTAL TIME TAKING CARE OF THIS PATIENT: 35 minutes.    Alford Highland M.D on 11/03/2015 at 10:03 AM  Between 7am to 6pm - Pager - [phone]  After 6pm go to [email or web address] - password [email or web address] [facility]  [place]  [facility]  Office  [phone]  CC: Primary care  physician; [facility]

## 2016-04-20 ENCOUNTER — Emergency Department
Admission: EM | Admit: 2016-04-20 | Discharge: 2016-04-20 | Disposition: A | Discharge status: Home / Self Care | Payer: Medicare (Managed Care) | Attending: Student | Admitting: Student

## 2016-04-20 ENCOUNTER — Encounter: Payer: Self-pay | Admitting: Emergency Medicine

## 2016-04-20 DIAGNOSIS — Z0389 Encounter for observation for other suspected diseases and conditions ruled out: Secondary | ICD-10-CM | POA: Diagnosis not present

## 2016-04-20 DIAGNOSIS — Z79899 Other long term (current) drug therapy: Secondary | ICD-10-CM | POA: Insufficient documentation

## 2016-04-20 DIAGNOSIS — E119 Type 2 diabetes mellitus without complications: Secondary | ICD-10-CM | POA: Diagnosis not present

## 2016-04-20 DIAGNOSIS — N183 Chronic kidney disease, stage 3 (moderate): Secondary | ICD-10-CM | POA: Diagnosis not present

## 2016-04-20 DIAGNOSIS — Z7982 Long term (current) use of aspirin: Secondary | ICD-10-CM | POA: Diagnosis not present

## 2016-04-20 DIAGNOSIS — W19XXXA Unspecified fall, initial encounter: Secondary | ICD-10-CM

## 2016-04-20 DIAGNOSIS — W050XXA Fall from non-moving wheelchair, initial encounter: Secondary | ICD-10-CM | POA: Diagnosis not present

## 2016-04-20 DIAGNOSIS — Y999 Unspecified external cause status: Secondary | ICD-10-CM | POA: Insufficient documentation

## 2016-04-20 DIAGNOSIS — F0390 Unspecified dementia without behavioral disturbance: Secondary | ICD-10-CM | POA: Diagnosis not present

## 2016-04-20 DIAGNOSIS — Y9289 Other specified places as the place of occurrence of the external cause: Secondary | ICD-10-CM | POA: Insufficient documentation

## 2016-04-20 DIAGNOSIS — I129 Hypertensive chronic kidney disease with stage 1 through stage 4 chronic kidney disease, or unspecified chronic kidney disease: Secondary | ICD-10-CM | POA: Diagnosis not present

## 2016-04-20 DIAGNOSIS — Y9389 Activity, other specified: Secondary | ICD-10-CM | POA: Diagnosis not present

## 2016-04-20 NOTE — ED Provider Notes (Signed)
[facility] Emergency Department Provider Note   ____________________________________________   First MD Initiated Contact with Patient 04/20/16 1339     (approximate)  I have reviewed the triage vital signs and the nursing notes.   HISTORY  Chief Complaint Fall  Caveat-history of present illness review of systems is limited due to the patient's dementia, all information is obtained from staff at [facility].  HPI Edward Kim is a 82 y.o. male with history of dementia, CK D, hypertension and hyperlipidemia, on daily aspirin but otherwise not anticoagulated who presents for evaluation of a fall at his living facility today at approximately 12:45 PM. Staff reported that they were helping to pressure over residence out of the dining room. The patient was in the dining room however staff stepped away briefly and when they returned, it appeared that the patient had fallen on the floor after trying stand up from his wheelchair. Staff did not think he hit his head or lost cnsciousness and he has not been complaining of any pain complaints however it is their policy to send any unwitnessed fall to the emergency department for evaluation. Staff reports that he has been in his usual state of health without illness, no vomiting, diarrhea, fevers, chills, or any other complaints.   Past Medical History:  Diagnosis Date  . Anemia   . CKD (chronic kidney disease), stage III   . Dementia   . HLD (hyperlipidemia)   . HTN (hypertension)     Patient Active Problem List   Diagnosis Date Noted  . Influenza 11/01/2015  . Generalized weakness 11/01/2015  . CKD (chronic kidney disease), stage III 11/01/2015  . Diabetes mellitus (HCC) 11/01/2015  . Elevated transaminase level 11/01/2015  . Elevated troponin 11/01/2015    History reviewed. No pertinent surgical history.  Prior to Admission medications   Medication Sig Start Date End Date Taking? Authorizing Provider    aspirin EC 81 MG tablet Take 81 mg by mouth daily.    Historical Provider, MD  donepezil (ARICEPT) 5 MG tablet Take 5 mg by mouth at bedtime.    Historical Provider, MD  lovastatin (MEVACOR) 40 MG tablet Take 80 mg by mouth at bedtime.    Historical Provider, MD  metoprolol tartrate (LOPRESSOR) 25 MG tablet Take 1 tablet (25 mg total) by mouth 2 (two) times daily. 11/03/15   Alford Highland, MD  oseltamivir (TAMIFLU) 30 MG capsule One tablet twice a day for three days then stop 11/03/15   Alford Highland, MD  pantoprazole (PROTONIX) 40 MG tablet Take 40 mg by mouth daily.    Historical Provider, MD  quinapril (ACCUPRIL) 10 MG tablet Take 10 mg by mouth daily.    Historical Provider, MD    Allergies  Review of patient's allergies indicates no known allergies.  Family History  Problem Relation Age of Onset  . Family history unknown: Yes    Social History Social History  Substance Use Topics  . Smoking status: Never Smoker  . Smokeless tobacco: Not on file  . Alcohol use Not on file    Review of Systems  Caveat-history of present illness review of systems is limited due to the patient's dementia, all information is obtained from staff at [facility] house. ____________________________________________   PHYSICAL EXAM:  VITAL SIGNS: ED Triage Vitals  Enc Vitals Group     BP 04/20/16 1322 127/65     Pulse Rate 04/20/16 1322 83     Resp 04/20/16 1322 18  Temp 04/20/16 1322 97.7 F (36.5 C)     Temp Source 04/20/16 1322 Oral     SpO2 04/20/16 1322 100 %     Weight 04/20/16 1323 193 lb (87.5 kg)     Height 04/20/16 1323 6\' 2"  (1.88 m)     Head Circumference --      Peak Flow --      Pain Score 04/20/16 1323 0     Pain Loc --      Pain Edu? --      Excl. in GC? --     Constitutional: Alert and oriented to self only but not to place or year. He is pleasantly demented with no complaints and denies any pain. Follows commands and is cooperative with the examination. Well  appearing and in no acute distress. Eyes: Conjunctivae are normal. PERRL. EOMI. Head: Atraumatic. Nose: No congestion/rhinnorhea. Mouth/Throat: Mucous membranes are moist.  Oropharynx non-erythematous. Neck: No stridor.  No cervical spine tenderness to palpation. Cardiovascular: Normal rate, regular rhythm. Grossly normal heart sounds.  Good peripheral circulation. Respiratory: Normal respiratory effort.  No retractions. Lungs CTAB. Gastrointestinal: Soft and nontender. No distention. No CVA tenderness. Genitourinary: deferred Musculoskeletal: No lower extremity tenderness nor edema.  No joint effusions. No midline T or L-spine tenderness to palpation. Neurologic:  Normal speech and language. No gross focal neurologic deficits are appreciated. Ambulates well with assistance. Full range of motion at the hip joints bilaterally, no tenderness to palpation throughout the large joints, no trauma to the extremities. Skin:  Skin is warm, dry and intact. No rash noted. Psychiatric: Mood and affect are normal. Speech and behavior are normal.  ____________________________________________   LABS (all labs ordered are listed, but only abnormal results are displayed)  Labs Reviewed - No data to display ____________________________________________  EKG  none ____________________________________________  RADIOLOGY  none ____________________________________________   PROCEDURES  Procedure(s) performed: None  Procedures  Critical Care performed: No  ____________________________________________   INITIAL IMPRESSION / ASSESSMENT AND PLAN / ED COURSE  Pertinent labs & imaging results that were available during my care of the patient were reviewed by me and considered in my medical decision making (see chart for details).  Edward Kim is a 82 y.o. male with history of dementia, CK D, hypertension and hyperlipidemia, on daily aspirin but otherwise not anticoagulated who presents for  evaluation of a fall at his living facility today at approximately 12:45 PM. Exam, he is very well-appearing and in no acute distress. His vital signs are stable and he is afebrile. He is pleasantly demented with no pain complaints and a benign/atraumatic exam. Suspect mechanical fall without any clinical evidence of injury. We'll DC with return precautions and close PCP follow-up. No indication for labs or imaging at this time.  Clinical Course     ____________________________________________   FINAL CLINICAL IMPRESSION(S) / ED DIAGNOSES  Final diagnoses:  Fall, initial encounter      NEW MEDICATIONS STARTED DURING THIS VISIT:  New Prescriptions   No medications on file     Note:  This document was prepared using Dragon voice recognition software and may include unintentional dictation errors.    Gayla DossEryka A Bebe Moncure, MD 04/20/16 1430

## 2016-04-20 NOTE — ED Triage Notes (Signed)
Patient was found sitting on the floor of the dining hall. No injuries, no complaints.

## 2016-07-01 ENCOUNTER — Emergency Department: Payer: Medicare (Managed Care)

## 2016-07-01 ENCOUNTER — Emergency Department
Admission: EM | Admit: 2016-07-01 | Discharge: 2016-07-02 | Disposition: A | Discharge status: Home / Self Care | Payer: Medicare (Managed Care) | Attending: Emergency Medicine | Admitting: Emergency Medicine

## 2016-07-01 ENCOUNTER — Encounter: Payer: Self-pay | Admitting: *Deleted

## 2016-07-01 DIAGNOSIS — Z7982 Long term (current) use of aspirin: Secondary | ICD-10-CM | POA: Diagnosis not present

## 2016-07-01 DIAGNOSIS — I129 Hypertensive chronic kidney disease with stage 1 through stage 4 chronic kidney disease, or unspecified chronic kidney disease: Secondary | ICD-10-CM | POA: Insufficient documentation

## 2016-07-01 DIAGNOSIS — E1122 Type 2 diabetes mellitus with diabetic chronic kidney disease: Secondary | ICD-10-CM | POA: Insufficient documentation

## 2016-07-01 DIAGNOSIS — Y92002 Bathroom of unspecified non-institutional (private) residence single-family (private) house as the place of occurrence of the external cause: Secondary | ICD-10-CM | POA: Diagnosis not present

## 2016-07-01 DIAGNOSIS — Y939 Activity, unspecified: Secondary | ICD-10-CM | POA: Insufficient documentation

## 2016-07-01 DIAGNOSIS — I6782 Cerebral ischemia: Secondary | ICD-10-CM | POA: Diagnosis not present

## 2016-07-01 DIAGNOSIS — W19XXXA Unspecified fall, initial encounter: Secondary | ICD-10-CM | POA: Insufficient documentation

## 2016-07-01 DIAGNOSIS — N183 Chronic kidney disease, stage 3 (moderate): Secondary | ICD-10-CM | POA: Insufficient documentation

## 2016-07-01 DIAGNOSIS — Z048 Encounter for examination and observation for other specified reasons: Secondary | ICD-10-CM | POA: Insufficient documentation

## 2016-07-01 DIAGNOSIS — Z79899 Other long term (current) drug therapy: Secondary | ICD-10-CM | POA: Diagnosis not present

## 2016-07-01 DIAGNOSIS — Y999 Unspecified external cause status: Secondary | ICD-10-CM | POA: Diagnosis not present

## 2016-07-01 LAB — CBC
HEMATOCRIT: 39.5 % — AB (ref 40.0–52.0)
HEMOGLOBIN: 13.2 g/dL (ref 13.0–18.0)
MCH: 30.8 pg (ref 26.0–34.0)
MCHC: 33.5 g/dL (ref 32.0–36.0)
MCV: 91.9 fL (ref 80.0–100.0)
Platelets: 157 10*3/uL (ref 150–440)
RBC: 4.3 MIL/uL — AB (ref 4.40–5.90)
RDW: 13.3 % (ref 11.5–14.5)
WBC: 6.7 10*3/uL (ref 3.8–10.6)

## 2016-07-01 NOTE — ED Triage Notes (Signed)
Pt arrived to ED from [facility] after an unwitnessed fall. PT was found beside bed. Pt denies pain and denies hitting head or LOC. PT has hx of dementia but presents to ED alert and in NAD.

## 2016-07-01 NOTE — ED Notes (Signed)
Patient transported to CT 

## 2016-07-01 NOTE — ED Notes (Signed)
Brandy at [facility] reports pt was found on bathroom floor under sink and staff was unsure if pt had hit head or hurt self. Per Gearldine BienenstockBrandy pt has had increased mobility and change in behavior today and staff is requesting pt be checked for a UTI. MD made aware.

## 2016-07-01 NOTE — ED Provider Notes (Signed)
[facility] Emergency Department Provider Note  ____________________________________________  Time seen: 10:50 PM  I have reviewed the triage vital signs and the nursing notes.   HISTORY  Chief Complaint Fall     HPI Edward Kim is a 82 y.o. male with below listed chronic medical conditions including dementia presents from [place] [facility] with an unwitnessed fall. Per EMS the patient was found beside his bed denying any discomfort. Patient presents to the emergency department denying any discomfort or complaint. Patient states that he was "trying to get comfortable in bed when he fell out of the bed.   Past Medical History:  Diagnosis Date  . Anemia   . CKD (chronic kidney disease), stage III   . Dementia   . HLD (hyperlipidemia)   . HTN (hypertension)     Patient Active Problem List   Diagnosis Date Noted  . Influenza 11/01/2015  . Generalized weakness 11/01/2015  . CKD (chronic kidney disease), stage III 11/01/2015  . Diabetes mellitus (HCC) 11/01/2015  . Elevated transaminase level 11/01/2015  . Elevated troponin 11/01/2015    History reviewed. No pertinent surgical history.  Current Outpatient Rx  . Order #: [id number] Class: Historical Med  . Order #: [id number] Class: Historical Med  . Order #: [id number] Class: Historical Med  . Order #: [id number] Class: Print  . Order #: [id number] Class: Print  . Order #: [id number] Class: Historical Med  . Order #: [id number] Class: Historical Med    Allergies Patient has no known allergies.  Family History  Problem Relation Age of Onset  . Family history unknown: Yes    Social History Social History  Substance Use Topics  . Smoking status: Never Smoker  . Smokeless tobacco: Never Used  . Alcohol use Not on file    Review of Systems  Constitutional: Negative for fever. Eyes: Negative for visual changes. ENT: Negative for sore throat. Cardiovascular: Negative for chest  pain. Respiratory: Negative for shortness of breath. Gastrointestinal: Negative for abdominal pain, vomiting and diarrhea. Genitourinary: Negative for dysuria. Musculoskeletal: Negative for back pain. Skin: Negative for rash. Neurological: Negative for headaches, focal weakness or numbness.   10-point ROS otherwise negative.  ____________________________________________   PHYSICAL EXAM:  VITAL SIGNS: ED Triage Vitals  Enc Vitals Group     BP 07/01/16 2248 (!) 153/79     Pulse Rate 07/01/16 2248 84     Resp 07/01/16 2248 16     Temp 07/01/16 2248 98.7 F (37.1 C)     Temp Source 07/01/16 2248 Oral     SpO2 07/01/16 2248 98 %     Weight 07/01/16 2250 193 lb (87.5 kg)     Height --      Head Circumference --      Peak Flow --      Pain Score --      Pain Loc --      Pain Edu? --      Excl. in GC? --     Constitutional: Alert Well appearing and in no distress. Eyes: Conjunctivae are normal. PERRL. Normal extraocular movements. ENT   Head: Normocephalic and atraumatic.   Nose: No congestion/rhinnorhea.   Mouth/Throat: Mucous membranes are moist.   Neck: No stridor. Hematological/Lymphatic/Immunilogical: No cervical lymphadenopathy. Cardiovascular: Normal rate, regular rhythm. Normal and symmetric distal pulses are present in all extremities. Systolic ejection murmur noted right sternal border. Respiratory: Normal respiratory effort without tachypnea nor retractions. Breath sounds are clear and equal bilaterally. No wheezes/rales/rhonchi. Gastrointestinal: Soft and nontender. No distention. There is  no CVA tenderness. Genitourinary: deferred Musculoskeletal: Nontender with normal range of motion in all extremities. No joint effusions.  No lower extremity tenderness nor edema. Neurologic:  Normal speech and language. No gross focal neurologic deficits are appreciated. Speech is normal.  Skin:  Skin is warm, dry and intact. No rash noted. Psychiatric: Mood and  affect are normal. Speech and behavior are normal. Patient exhibits appropriate insight and judgment.  ____________________________________________    LABS (pertinent positives/negatives)  Labs Reviewed  BASIC METABOLIC PANEL - Abnormal; Notable for the following:       Result Value   Glucose, Bld 183 (*)    Calcium 8.6 (*)    GFR calc non Af Amer 56 (*)    All other components within normal limits  CBC - Abnormal; Notable for the following:    RBC 4.30 (*)    HCT 39.5 (*)    All other components within normal limits  URINALYSIS COMPLETEWITH MICROSCOPIC ([facility] ONLY) - Abnormal; Notable for the following:    Color, Urine YELLOW (*)    APPearance CLEAR (*)    Glucose, UA >500 (*)    Ketones, ur TRACE (*)    All other components within normal limits  TROPONIN I     ____________________________________________   EKG  ED ECG REPORT I, Liberty N BROWN, the attending physician, personally viewed and interpreted this ECG.   Date: 07/02/2016  EKG Time: 10:55 PM  Rate: 84  Rhythm: Normal sinus rhythm  Axis: Normal  Intervals: Normal  ST&T Change: None   ____________________________________________    RADIOLOGY  CLINICAL DATA:  Found on bathroom floor. Question of head injury. Initial encounter.  EXAM: CT HEAD WITHOUT CONTRAST  TECHNIQUE: Contiguous axial images were obtained from the base of the skull through the vertex without intravenous contrast.  COMPARISON:  None.  FINDINGS: Brain: No evidence of acute infarction, hemorrhage, hydrocephalus, extra-axial collection or mass lesion/mass effect.  Prominence of the ventricles and sulci reflects moderate cortical volume loss. Diffuse periventricular and subcortical white matter change likely reflects small vessel ischemic microangiopathy. Mild cerebellar atrophy is noted.  The brainstem and fourth ventricle are within normal limits. The basal ganglia are unremarkable in appearance. The  cerebral hemispheres demonstrate grossly normal gray-white differentiation. No mass effect or midline shift is seen.  Vascular: No hyperdense vessel or unexpected calcification.  Skull: There is no evidence of fracture; visualized osseous structures are unremarkable in appearance.  Sinuses/Orbits: The orbits are within normal limits. The paranasal sinuses and mastoid air cells are well-aerated.  Other: No significant soft tissue abnormalities are seen.  IMPRESSION: 1. No evidence of traumatic intracranial injury or fracture. 2. Moderate cortical volume loss and diffuse small vessel ischemic microangiopathy.   Electronically Signed   By: Roanna RaiderJeffery  Chang M.D.   On: 07/02/2016 00:17    Procedures    INITIAL IMPRESSION / ASSESSMENT AND PLAN / ED COURSE  Pertinent labs & imaging results that were available during my care of the patient were reviewed by me and considered in my medical decision making (see chart for details).  Spoke with the nursing staff at [facility] who stated that they found the patient laying on the bathroom floor. Laboratory data EKG CT scan performed in emergency department revealed no acute abnormality.      FINAL CLINICAL IMPRESSION(S) / ED DIAGNOSES  Final diagnoses:  Fall, initial encounter      Darci Currentandolph N Brown, MD 07/02/16 [phone]

## 2016-07-02 LAB — URINALYSIS COMPLETE WITH MICROSCOPIC (ARMC ONLY)
BILIRUBIN URINE: NEGATIVE
Bacteria, UA: NONE SEEN
Glucose, UA: >500 mg/dL — AB
Hgb urine dipstick: NEGATIVE
Leukocytes, UA: NEGATIVE
NITRITE: NEGATIVE
PH: 5 (ref 5.0–8.0)
PROTEIN: NEGATIVE mg/dL
Specific Gravity, Urine: 1.022 (ref 1.005–1.030)
Squamous Epithelial / LPF: NONE SEEN

## 2016-07-02 LAB — BASIC METABOLIC PANEL
ANION GAP: 9 (ref 5–15)
BUN: 15 mg/dL (ref 6–20)
CALCIUM: 8.6 mg/dL — AB (ref 8.9–10.3)
CO2: 26 mmol/L (ref 22–32)
CREATININE: 1.11 mg/dL (ref 0.61–1.24)
Chloride: 104 mmol/L (ref 101–111)
GFR, EST NON AFRICAN AMERICAN: 56 mL/min — AB (ref >60)
Glucose, Bld: 183 mg/dL — ABNORMAL HIGH (ref 65–99)
Potassium: 3.9 mmol/L (ref 3.5–5.1)
SODIUM: 139 mmol/L (ref 135–145)

## 2016-07-02 LAB — TROPONIN I: Troponin I: <0.03 ng/mL (ref <0.03)

## 2016-07-02 NOTE — ED Notes (Signed)
Report called to [facility]. Pt with EMS at this time. PT in NAD and able to stand and transfer to stretcher.

## 2016-08-27 ENCOUNTER — Emergency Department
Admission: EM | Admit: 2016-08-27 | Discharge: 2016-08-27 | Disposition: A | Discharge status: Home / Self Care | Payer: Medicare (Managed Care) | Attending: Emergency Medicine | Admitting: Emergency Medicine

## 2016-08-27 DIAGNOSIS — S4991XA Unspecified injury of right shoulder and upper arm, initial encounter: Secondary | ICD-10-CM | POA: Diagnosis present

## 2016-08-27 DIAGNOSIS — Y999 Unspecified external cause status: Secondary | ICD-10-CM | POA: Diagnosis not present

## 2016-08-27 DIAGNOSIS — N183 Chronic kidney disease, stage 3 (moderate): Secondary | ICD-10-CM | POA: Insufficient documentation

## 2016-08-27 DIAGNOSIS — Y939 Activity, unspecified: Secondary | ICD-10-CM | POA: Insufficient documentation

## 2016-08-27 DIAGNOSIS — M25511 Pain in right shoulder: Secondary | ICD-10-CM | POA: Insufficient documentation

## 2016-08-27 DIAGNOSIS — F0390 Unspecified dementia without behavioral disturbance: Secondary | ICD-10-CM | POA: Insufficient documentation

## 2016-08-27 DIAGNOSIS — E1122 Type 2 diabetes mellitus with diabetic chronic kidney disease: Secondary | ICD-10-CM | POA: Diagnosis not present

## 2016-08-27 DIAGNOSIS — W19XXXA Unspecified fall, initial encounter: Secondary | ICD-10-CM | POA: Insufficient documentation

## 2016-08-27 DIAGNOSIS — I129 Hypertensive chronic kidney disease with stage 1 through stage 4 chronic kidney disease, or unspecified chronic kidney disease: Secondary | ICD-10-CM | POA: Diagnosis not present

## 2016-08-27 DIAGNOSIS — Y92129 Unspecified place in nursing home as the place of occurrence of the external cause: Secondary | ICD-10-CM | POA: Insufficient documentation

## 2016-08-27 HISTORY — DX: Type 2 diabetes mellitus without complications: E11.9

## 2016-08-27 LAB — CBC
HCT: 40.2 % (ref 40.0–52.0)
Hemoglobin: 13.4 g/dL (ref 13.0–18.0)
MCH: 29.9 pg (ref 26.0–34.0)
MCHC: 33.2 g/dL (ref 32.0–36.0)
MCV: 90.1 fL (ref 80.0–100.0)
PLATELETS: 191 10*3/uL (ref 150–440)
RBC: 4.46 MIL/uL (ref 4.40–5.90)
RDW: 13 % (ref 11.5–14.5)
WBC: 9.7 10*3/uL (ref 3.8–10.6)

## 2016-08-27 LAB — URINALYSIS, ROUTINE W REFLEX MICROSCOPIC
BILIRUBIN URINE: NEGATIVE
Bacteria, UA: NONE SEEN
GLUCOSE, UA: 150 mg/dL — AB
Ketones, ur: 5 mg/dL — AB
LEUKOCYTES UA: NEGATIVE
NITRITE: NEGATIVE
Protein, ur: 30 mg/dL — AB
SPECIFIC GRAVITY, URINE: 1.018 (ref 1.005–1.030)
SQUAMOUS EPITHELIAL / LPF: NONE SEEN
pH: 6 (ref 5.0–8.0)

## 2016-08-27 LAB — BASIC METABOLIC PANEL
Anion gap: 6 (ref 5–15)
BUN: 10 mg/dL (ref 6–20)
CHLORIDE: 100 mmol/L — AB (ref 101–111)
CO2: 31 mmol/L (ref 22–32)
CREATININE: 1.14 mg/dL (ref 0.61–1.24)
Calcium: 9 mg/dL (ref 8.9–10.3)
GFR calc non Af Amer: 54 mL/min — ABNORMAL LOW (ref >60)
Glucose, Bld: 214 mg/dL — ABNORMAL HIGH (ref 65–99)
Potassium: 3.9 mmol/L (ref 3.5–5.1)
SODIUM: 137 mmol/L (ref 135–145)

## 2016-08-27 NOTE — Discharge Instructions (Signed)
You have been seen in the emergency department after a fall. Your workup as shown normal results. Please follow-up with your primary care doctor in 2-3 days for recheck/reevaluation. Return to the emergency department for any further falls, or any other symptom personally concerning to your selves.

## 2016-08-27 NOTE — ED Provider Notes (Signed)
[facility] Emergency Department Provider Note  Time seen: 7:49 PM  I have reviewed the triage vital signs and the nursing notes.   HISTORY  Chief Complaint Fall    HPI Edward Kim is a 83 y.o. male with a past medical history of anemia, dementia, diabetes, hypertension hyperlipidemia who presents to the emergency department after a fall. According to EMS report the patient lives at a nursing facility has significant dementia at baseline and does not ambulate but is able to stand and pivot to wheelchair. Nursing staff stated they found the patient on the ground, so they called EMS. At the time EMS arrived the patient was lying on his right side and was complaining of right shoulder pain. Upon arrival to the emergency department the patient is alert, oriented to self, cannot tell me anything about the fall. Patient denies any pain at this time. No complaints.  Past Medical History:  Diagnosis Date  . Anemia   . CKD (chronic kidney disease), stage III   . Dementia   . Diabetes mellitus without complication (HCC)   . HLD (hyperlipidemia)   . HTN (hypertension)     Patient Active Problem List   Diagnosis Date Noted  . Influenza 11/01/2015  . Generalized weakness 11/01/2015  . CKD (chronic kidney disease), stage III 11/01/2015  . Diabetes mellitus (HCC) 11/01/2015  . Elevated transaminase level 11/01/2015  . Elevated troponin 11/01/2015    History reviewed. No pertinent surgical history.  Prior to Admission medications   Medication Sig Start Date End Date Taking? Authorizing Provider  aspirin EC 81 MG tablet Take 81 mg by mouth daily.    Historical Provider, MD  donepezil (ARICEPT) 5 MG tablet Take 5 mg by mouth at bedtime.    Historical Provider, MD  lovastatin (MEVACOR) 40 MG tablet Take 80 mg by mouth at bedtime.    Historical Provider, MD  metoprolol tartrate (LOPRESSOR) 25 MG tablet Take 1 tablet (25 mg total) by mouth 2 (two) times daily. 11/03/15    Alford Highland, MD  oseltamivir (TAMIFLU) 30 MG capsule One tablet twice a day for three days then stop 11/03/15   Alford Highland, MD  pantoprazole (PROTONIX) 40 MG tablet Take 40 mg by mouth daily.    Historical Provider, MD  quinapril (ACCUPRIL) 10 MG tablet Take 10 mg by mouth daily.    Historical Provider, MD    No Known Allergies  Family History  Problem Relation Age of Onset  . Family history unknown: Yes    Social History Social History  Substance Use Topics  . Smoking status: Never Smoker  . Smokeless tobacco: Never Used  . Alcohol use Not on file    Review of Systems Unable to obtain an adequate/accurate review of systems due to significant dementia ____________________________________________   PHYSICAL EXAM:  VITAL SIGNS: ED Triage Vitals  Enc Vitals Group     BP 08/27/16 1917 (!) 159/85     Pulse Rate 08/27/16 1917 97     Resp 08/27/16 1917 18     Temp 08/27/16 1917 97.6 F (36.4 C)     Temp Source 08/27/16 1917 Oral     SpO2 08/27/16 1917 97 %     Weight --      Height --      Head Circumference --      Peak Flow --      Pain Score 08/27/16 1916 0     Pain Loc --      Pain  Edu? --      Excl. in GC? --     Constitutional: Alert. Well appearing and in no distress. Eyes: Normal exam ENT   Head: Normocephalic and atraumatic.No C-spine tenderness.   Nose: No congestion/rhinnorhea.   Mouth/Throat: Mucous membranes are moist. Cardiovascular: Normal rate, regular rhythm. 2/6 systolic ejection murmur Respiratory: Normal respiratory effort without tachypnea nor retractions. Breath sounds are clear Gastrointestinal: Soft and nontender. No distention.   Musculoskeletal: Patient has an old abrasion to his right knee, no new abrasions noted. Good range of motion in all extremities including bilateral lower extremities hips and upper extremities. No shoulder pain elicited on examination. No back pain or C-spine tenderness. Neurologic:  Normal speech  and language. No gross focal neurologic deficits Skin:  Skin is warm, dry Psychiatric: Mood and affect are normal.   ____________________________________________    EKG  EKG reviewed and interpreted by myself shows normal sinus rhythm at 90 bpm. Narrow QRS, normal axis, largely normal intervals with nonspecific ST changes. No ST elevations.  ____________________________________________   INITIAL IMPRESSION / ASSESSMENT AND PLAN / ED COURSE  Pertinent labs & imaging results that were available during my care of the patient were reviewed by me and considered in my medical decision making (see chart for details).  The patient presents the emergency department after an unwitnessed fall. Patient has a largely atraumatic exam, no signs of head trauma. Nontender spine. Good range of motion in all extremities. Patient does have an abrasion to the right knee but this appears to be at least several days old. Nursing home staff was concerned about possible urinary tract infection we will check basic labs and monitor in the emergency department. At this time I do not see an indication for imaging as the patient appears well with no complaints, and no signs of trauma on examination.  Labs so far within normal limits including urinalysis, metabolic panel pending if normal patient will be discharged to his nursing facility. Patient continues to appear well and the emergency department. ____________________________________________   FINAL CLINICAL IMPRESSION(S) / ED DIAGNOSES  Thresa RossFall    Kylie Simmonds, MD 08/27/16 2233

## 2016-08-27 NOTE — ED Notes (Signed)
Pt unable to sign d/t dementia. Pt went back to [facility] via [facility].

## 2016-08-27 NOTE — ED Notes (Addendum)
Pt is wheelchair bound, non ambulatory. Pt pivots to wheelchair to bed. Pt is alert, oriented to person and situation. Denies dizziness, nausea, weakness, blurred vision. Pt has no complaints at this time. Answers questions appropriately.

## 2016-08-27 NOTE — ED Triage Notes (Signed)
Pt presents from [facility] via [facility] for an "unwitnessed fall." pt was found lying on floor, R side lying, abraison to R knee. Pt states no pain at this time. [facility] staff told EMS he was c/o of R shoulder pain, they informed EMS they want him checked for R shoulder pain and possibly UTI. Pt has hx dementia, DM 2. CBG 145, VS 147/50, 98 HR, 95%.

## 2016-09-05 ENCOUNTER — Encounter: Payer: Self-pay | Admitting: Emergency Medicine

## 2016-09-05 ENCOUNTER — Emergency Department: Payer: Medicare (Managed Care)

## 2016-09-05 ENCOUNTER — Emergency Department
Admission: EM | Admit: 2016-09-05 | Discharge: 2016-09-05 | Disposition: A | Discharge status: Home / Self Care | Payer: Medicare (Managed Care) | Attending: Emergency Medicine | Admitting: Emergency Medicine

## 2016-09-05 DIAGNOSIS — Z79899 Other long term (current) drug therapy: Secondary | ICD-10-CM | POA: Diagnosis not present

## 2016-09-05 DIAGNOSIS — R4182 Altered mental status, unspecified: Secondary | ICD-10-CM | POA: Insufficient documentation

## 2016-09-05 DIAGNOSIS — Y999 Unspecified external cause status: Secondary | ICD-10-CM | POA: Diagnosis not present

## 2016-09-05 DIAGNOSIS — Z7982 Long term (current) use of aspirin: Secondary | ICD-10-CM | POA: Insufficient documentation

## 2016-09-05 DIAGNOSIS — W19XXXA Unspecified fall, initial encounter: Secondary | ICD-10-CM | POA: Insufficient documentation

## 2016-09-05 DIAGNOSIS — I129 Hypertensive chronic kidney disease with stage 1 through stage 4 chronic kidney disease, or unspecified chronic kidney disease: Secondary | ICD-10-CM | POA: Diagnosis not present

## 2016-09-05 DIAGNOSIS — E1122 Type 2 diabetes mellitus with diabetic chronic kidney disease: Secondary | ICD-10-CM | POA: Diagnosis not present

## 2016-09-05 DIAGNOSIS — Y939 Activity, unspecified: Secondary | ICD-10-CM | POA: Insufficient documentation

## 2016-09-05 DIAGNOSIS — N183 Chronic kidney disease, stage 3 (moderate): Secondary | ICD-10-CM | POA: Diagnosis not present

## 2016-09-05 DIAGNOSIS — Y929 Unspecified place or not applicable: Secondary | ICD-10-CM | POA: Diagnosis not present

## 2016-09-05 NOTE — ED Notes (Signed)
Report from Kim, RN

## 2016-09-05 NOTE — ED Notes (Signed)
Pt brief changed. Pt clean upon discharge from ER. Left with all of belongings on EMS stretcher.

## 2016-09-05 NOTE — ED Notes (Signed)
Secretary called [facility] for transport back to [facility]

## 2016-09-05 NOTE — Discharge Instructions (Signed)
Return to the ER for worsening symptoms, persistent vomiting, difficulty breathing or other concerns. °

## 2016-09-05 NOTE — ED Triage Notes (Signed)
Pt to rm 10 via EMS from [facility], EMS report staff states they found pt on floor next to bed, no obvious trauma seen, denies pain.  Pt baseline dementia

## 2016-09-05 NOTE — ED Provider Notes (Signed)
[facility] Emergency Department Provider Note   ____________________________________________   First MD Initiated Contact with Patient 09/05/16 0308     (approximate)  I have reviewed the triage vital signs and the nursing notes.   HISTORY  Chief Complaint Fall  History limited by dementia  HPI Edward Kim is a 83 y.o. male brought to the ED via EMS from [facility] with a chief complaint of a witnessed fall. Patient has a history of dementia who was found on the floor next to his bed without obvious trauma seen. Staff reports patient is in his baseline mental state. Patient voices no complaints. Rest of history is limited secondary to patient's dementia.   Past Medical History:  Diagnosis Date  . Anemia   . CKD (chronic kidney disease), stage III   . Dementia   . Diabetes mellitus without complication (HCC)   . HLD (hyperlipidemia)   . HTN (hypertension)     Patient Active Problem List   Diagnosis Date Noted  . Influenza 11/01/2015  . Generalized weakness 11/01/2015  . CKD (chronic kidney disease), stage III 11/01/2015  . Diabetes mellitus (HCC) 11/01/2015  . Elevated transaminase level 11/01/2015  . Elevated troponin 11/01/2015    History reviewed. No pertinent surgical history.  Prior to Admission medications   Medication Sig Start Date End Date Taking? Authorizing Provider  aspirin EC 81 MG tablet Take 81 mg by mouth daily.    Historical Provider, MD  donepezil (ARICEPT) 5 MG tablet Take 5 mg by mouth at bedtime.    Historical Provider, MD  lovastatin (MEVACOR) 40 MG tablet Take 80 mg by mouth at bedtime.    Historical Provider, MD  metoprolol tartrate (LOPRESSOR) 25 MG tablet Take 1 tablet (25 mg total) by mouth 2 (two) times daily. 11/03/15   Alford Highland, MD  oseltamivir (TAMIFLU) 30 MG capsule One tablet twice a day for three days then stop 11/03/15   Alford Highland, MD  pantoprazole (PROTONIX) 40 MG tablet Take 40 mg by  mouth daily.    Historical Provider, MD  quinapril (ACCUPRIL) 10 MG tablet Take 10 mg by mouth daily.    Historical Provider, MD    Allergies Patient has no known allergies.  Family History  Problem Relation Age of Onset  . Family history unknown: Yes    Social History Social History  Substance Use Topics  . Smoking status: Never Smoker  . Smokeless tobacco: Never Used  . Alcohol use No    Review of Systems  Constitutional: No fever/chills. Eyes: No visual changes. ENT: No sore throat. Cardiovascular: Denies chest pain. Respiratory: Denies shortness of breath. Gastrointestinal: No abdominal pain.  No nausea, no vomiting.  No diarrhea.  No constipation. Genitourinary: Negative for dysuria. Musculoskeletal: Negative for back pain. Skin: Negative for rash. Neurological: Negative for headaches, focal weakness or numbness.  Limited by dementia; 10-point ROS otherwise negative.  ____________________________________________   PHYSICAL EXAM:  VITAL SIGNS: ED Triage Vitals  Enc Vitals Group     BP 09/05/16 0252 (!) 156/95     Pulse Rate 09/05/16 0252 91     Resp 09/05/16 0252 20     Temp 09/05/16 0252 97.7 F (36.5 C)     Temp Source 09/05/16 0252 Oral     SpO2 09/05/16 0252 100 %     Weight 09/05/16 0253 193 lb (87.5 kg)     Height 09/05/16 0253 6\' 2"  (1.88 m)     Head Circumference --  Peak Flow --      Pain Score 09/05/16 0253 0     Pain Loc --      Pain Edu? --      Excl. in GC? --     Constitutional: Alert and oriented. Well appearing and in no acute distress. Eyes: Conjunctivae are normal. PERRL. EOMI. Head: Atraumatic. Nose: No congestion/rhinnorhea. Mouth/Throat: Mucous membranes are moist.  Oropharynx non-erythematous. Neck: No stridor.  No cervical spine tenderness to palpation. Cardiovascular: Normal rate, regular rhythm. Grossly normal heart sounds.  Good peripheral circulation. Respiratory: Normal respiratory effort.  No retractions. Lungs  CTAB. Gastrointestinal: Soft and nontender. No distention. No abdominal bruits. No CVA tenderness. Musculoskeletal: No lower extremity tenderness nor edema.  No joint effusions. Neurologic:  Alert and oriented to person. Normal speech and language. No gross focal neurologic deficits are appreciated. MAEx4. Skin:  Skin is warm, dry and intact. No rash noted. Psychiatric: Mood and affect are normal. Speech and behavior are normal.  ____________________________________________   LABS (all labs ordered are listed, but only abnormal results are displayed)  Labs Reviewed - No data to display ____________________________________________  EKG  ED ECG REPORT I, Kimothy Kishimoto J, the attending physician, personally viewed and interpreted this ECG.   Date: 09/05/2016  EKG Time: 0254  Rate: 91  Rhythm: normal EKG, normal sinus rhythm  Axis: Normal  Intervals:none  ST&T Change: Nonspecific  ____________________________________________  RADIOLOGY  CT head without contrast interpreted per Dr. Gwenyth Benderadparvar: No acute intracranial hemorrhage.    Age-related atrophy and chronic microvascular ischemic changes.   Pelvis x-rays interpreted per Dr. Gwenyth Benderadparvar: No acute/traumatic osseous pathology. ____________________________________________   PROCEDURES  Procedure(s) performed: None  Procedures  Critical Care performed: No  ____________________________________________   INITIAL IMPRESSION / ASSESSMENT AND PLAN / ED COURSE  Pertinent labs & imaging results that were available during my care of the patient were reviewed by me and considered in my medical decision making (see chart for details).  83 year old male who presents with unwitnessed fall; history of dementia. Will image head and pelvis.  Clinical Course as of Sep 06 611  Thu Sep 05, 2016  0522 Patient sleeping in no acute distress. Updated him of imaging results. Strict return precautions given. Patient verbalizes  understanding and agrees with plan of care.  [JS]    Clinical Course User Index [JS] Irean HongJade J Toye Rouillard, MD     ____________________________________________   FINAL CLINICAL IMPRESSION(S) / ED DIAGNOSES  Final diagnoses:  Fall, initial encounter      NEW MEDICATIONS STARTED DURING THIS VISIT:  New Prescriptions   No medications on file     Note:  This document was prepared using Dragon voice recognition software and may include unintentional dictation errors.    Irean HongJade J Collyn Ribas, MD 09/05/16 [id number]

## 2016-12-25 ENCOUNTER — Emergency Department: Payer: Medicare (Managed Care)

## 2016-12-25 ENCOUNTER — Inpatient Hospital Stay
Admission: EM | Admit: 2016-12-25 | Discharge: 2016-12-27 | DRG: 923 | Payer: Medicare (Managed Care) | Attending: Internal Medicine | Admitting: Internal Medicine

## 2016-12-25 DIAGNOSIS — E785 Hyperlipidemia, unspecified: Secondary | ICD-10-CM | POA: Diagnosis present

## 2016-12-25 DIAGNOSIS — D649 Anemia, unspecified: Secondary | ICD-10-CM | POA: Diagnosis present

## 2016-12-25 DIAGNOSIS — N183 Chronic kidney disease, stage 3 (moderate): Secondary | ICD-10-CM | POA: Diagnosis present

## 2016-12-25 DIAGNOSIS — A419 Sepsis, unspecified organism: Secondary | ICD-10-CM | POA: Diagnosis present

## 2016-12-25 DIAGNOSIS — W1839XA Other fall on same level, initial encounter: Secondary | ICD-10-CM | POA: Diagnosis present

## 2016-12-25 DIAGNOSIS — W19XXXA Unspecified fall, initial encounter: Secondary | ICD-10-CM

## 2016-12-25 DIAGNOSIS — T68XXXA Hypothermia, initial encounter: Principal | ICD-10-CM | POA: Diagnosis present

## 2016-12-25 DIAGNOSIS — F0390 Unspecified dementia without behavioral disturbance: Secondary | ICD-10-CM | POA: Diagnosis present

## 2016-12-25 DIAGNOSIS — I129 Hypertensive chronic kidney disease with stage 1 through stage 4 chronic kidney disease, or unspecified chronic kidney disease: Secondary | ICD-10-CM | POA: Diagnosis present

## 2016-12-25 DIAGNOSIS — E1122 Type 2 diabetes mellitus with diabetic chronic kidney disease: Secondary | ICD-10-CM | POA: Diagnosis present

## 2016-12-25 NOTE — ED Triage Notes (Signed)
Pt bib EMS from [facility] w/ c/o unwitnessed fall. Per EMS, staff found pt on floor w/ wheelchair on top after pt used life alert alarm. Pt oriented to self only, denies pain at this time. Per EMS, staff reported "bump" to L hip, area nontender to palpation. NAD

## 2016-12-25 NOTE — ED Notes (Signed)
Patient transported to X-ray 

## 2016-12-25 NOTE — ED Notes (Signed)
Pts rectal temp 93.5, MD Schaevitz informed

## 2016-12-25 NOTE — ED Provider Notes (Signed)
[facility] Emergency Department Provider Note  ____________________________________________   First MD Initiated Contact with Patient 12/25/16 2223     (approximate)  I have reviewed the triage vital signs and the nursing notes.   HISTORY  Chief Complaint Fall   HPI Edward Kim is a 83 y.o. male with a history of dementia as well as CK D who is presenting to the emergency department today after being found down beside his wheelchair. The patient hasn't no complaints. Staff at the memory care unit said that they thought that they may have felt a lump to his left hip.   Past Medical History:  Diagnosis Date  . Anemia   . CKD (chronic kidney disease), stage III   . Dementia   . Diabetes mellitus without complication (HCC)   . HLD (hyperlipidemia)   . HTN (hypertension)     Patient Active Problem List   Diagnosis Date Noted  . Influenza 11/01/2015  . Generalized weakness 11/01/2015  . CKD (chronic kidney disease), stage III 11/01/2015  . Diabetes mellitus (HCC) 11/01/2015  . Elevated transaminase level 11/01/2015  . Elevated troponin 11/01/2015    History reviewed. No pertinent surgical history.  Prior to Admission medications   Medication Sig Start Date End Date Taking? Authorizing Provider  aspirin EC 81 MG tablet Take 81 mg by mouth daily.    [provider]  donepezil (ARICEPT) 5 MG tablet Take 5 mg by mouth at bedtime.    [provider]  lovastatin (MEVACOR) 40 MG tablet Take 80 mg by mouth at bedtime.    [provider]  metoprolol tartrate (LOPRESSOR) 25 MG tablet Take 1 tablet (25 mg total) by mouth 2 (two) times daily. 11/03/15   Alford HighlandWieting, Richard, MD  oseltamivir (TAMIFLU) 30 MG capsule One tablet twice a day for three days then stop 11/03/15   Alford HighlandWieting, Richard, MD  pantoprazole (PROTONIX) 40 MG tablet Take 40 mg by mouth daily.    [provider]  quinapril (ACCUPRIL) 10 MG tablet Take 10 mg by  mouth daily.    [provider]    Allergies Patient has no known allergies.  Family History  Problem Relation Age of Onset  . Family history unknown: Yes    Social History Social History  Substance Use Topics  . Smoking status: Never Smoker  . Smokeless tobacco: Never Used  . Alcohol use No    Review of Systems  Level V caveat secondary to dementia.   ____________________________________________   PHYSICAL EXAM:  VITAL SIGNS: ED Triage Vitals  Enc Vitals Group     BP --      Pulse --      Resp --      Temp 12/25/16 2244 (!) 93.5 F (34.2 C)     Temp Source 12/25/16 2244 Rectal     SpO2 --      Weight 12/25/16 2213 190 lb (86.2 kg)     Height --      Head Circumference --      Peak Flow --      Pain Score --      Pain Loc --      Pain Edu? --      Excl. in GC? --     Constitutional: Alert and oriented. Well appearing and in no acute distress. Eyes: Conjunctivae are normal.  Head: Atraumatic. Nose: No congestion/rhinnorhea. Mouth/Throat: Mucous membranes are moist.  Neck: No stridor.  No tenderness to palpation. No deformity nor  step-off. Cardiovascular: Normal rate, regular rhythm. Grossly normal heart sounds.   Respiratory: Normal respiratory effort.  No retractions. Lungs CTAB. Gastrointestinal: Soft and nontender. No distention. No CVA tenderness. Musculoskeletal: No lower extremity tenderness nor edema.  No joint effusions. Pelvis is stable and nontender. 5 out of 5 strength to bilateral lower extremities. No tenderness to the chest wall. No tenderness of the thoracic or lumbar spines. No deformity nor step-off. Neurologic:  Normal speech and language. No gross focal neurologic deficits are appreciated. Skin:  Skin is warm, dry and intact. No rash noted. Psychiatric: Mood and affect are normal. Speech and behavior are normal.  ____________________________________________   LABS (all labs ordered are listed, but only abnormal results are  displayed)  Labs Reviewed  CULTURE, BLOOD (ROUTINE X 2)  CULTURE, BLOOD (ROUTINE X 2)  URINE CULTURE  CBC WITH DIFFERENTIAL/PLATELET  BASIC METABOLIC PANEL  URINALYSIS, COMPLETE (UACMP) WITH MICROSCOPIC  LACTIC ACID, PLASMA  LACTIC ACID, PLASMA   ____________________________________________  EKG    ____________________________________________  RADIOLOGY  No acute findings on the CAT scan of the head or x-rays of the pelvis and chest. ____________________________________________   PROCEDURES  Procedure(s) performed:   Procedures  Critical Care performed:   ____________________________________________   INITIAL IMPRESSION / ASSESSMENT AND PLAN / ED COURSE  Pertinent labs & imaging results that were available during my care of the patient were reviewed by me and considered in my medical decision making (see chart for details).  ----------------------------------------- 11:37 PM on 12/25/2016 -----------------------------------------  Family is now the bedside, Ms. Corbett who is the patient's power of attorney. She says that the patient seems to be acting his baseline. However, he had a rectal temperature of 93. We attempted repeat oral temperature is in the thermometer did not register. It seems that this is an accurate rectal temperature and the patient is hypothermic. Patient did not have a prolonged cold exposure. We will order blood work as well as blood cultures and a urinalysis. Suspect a possible infectious source. Ms. Laurence Aly says that the patient does have a history of UTI.      ____________________________________________   FINAL CLINICAL IMPRESSION(S) / ED DIAGNOSES  Final diagnoses:  Fall, initial encounter   Hypothermia    NEW MEDICATIONS STARTED DURING THIS VISIT:  New Prescriptions   No medications on file     Note:  This document was prepared using Dragon voice recognition software and may include unintentional dictation errors.      Myrna Blazer, MD 12/25/16 [phone]

## 2016-12-26 DIAGNOSIS — N183 Chronic kidney disease, stage 3 (moderate): Secondary | ICD-10-CM | POA: Diagnosis present

## 2016-12-26 DIAGNOSIS — E1122 Type 2 diabetes mellitus with diabetic chronic kidney disease: Secondary | ICD-10-CM | POA: Diagnosis present

## 2016-12-26 DIAGNOSIS — D649 Anemia, unspecified: Secondary | ICD-10-CM | POA: Diagnosis present

## 2016-12-26 DIAGNOSIS — A419 Sepsis, unspecified organism: Secondary | ICD-10-CM | POA: Diagnosis present

## 2016-12-26 DIAGNOSIS — E785 Hyperlipidemia, unspecified: Secondary | ICD-10-CM | POA: Diagnosis present

## 2016-12-26 DIAGNOSIS — I129 Hypertensive chronic kidney disease with stage 1 through stage 4 chronic kidney disease, or unspecified chronic kidney disease: Secondary | ICD-10-CM | POA: Diagnosis present

## 2016-12-26 DIAGNOSIS — T68XXXA Hypothermia, initial encounter: Principal | ICD-10-CM

## 2016-12-26 DIAGNOSIS — W1839XA Other fall on same level, initial encounter: Secondary | ICD-10-CM | POA: Diagnosis present

## 2016-12-26 DIAGNOSIS — F0390 Unspecified dementia without behavioral disturbance: Secondary | ICD-10-CM | POA: Diagnosis present

## 2016-12-26 LAB — CBC WITH DIFFERENTIAL/PLATELET
BASOS PCT: 1 %
Basophils Absolute: 0 10*3/uL (ref 0–0.1)
Eosinophils Absolute: 0.1 10*3/uL (ref 0–0.7)
Eosinophils Relative: 2 %
HEMATOCRIT: 37.1 % — AB (ref 40.0–52.0)
Hemoglobin: 12.3 g/dL — ABNORMAL LOW (ref 13.0–18.0)
Lymphocytes Relative: 49 %
Lymphs Abs: 1.8 10*3/uL (ref 1.0–3.6)
MCH: 29 pg (ref 26.0–34.0)
MCHC: 33.1 g/dL (ref 32.0–36.0)
MCV: 87.7 fL (ref 80.0–100.0)
MONO ABS: 0.3 10*3/uL (ref 0.2–1.0)
MONOS PCT: 9 %
NEUTROS ABS: 1.4 10*3/uL (ref 1.4–6.5)
Neutrophils Relative %: 39 %
Platelets: 168 10*3/uL (ref 150–440)
RBC: 4.24 MIL/uL — ABNORMAL LOW (ref 4.40–5.90)
RDW: 13.7 % (ref 11.5–14.5)
WBC: 3.7 10*3/uL — ABNORMAL LOW (ref 3.8–10.6)

## 2016-12-26 LAB — BASIC METABOLIC PANEL
Anion gap: 6 (ref 5–15)
BUN: 14 mg/dL (ref 6–20)
CALCIUM: 8.7 mg/dL — AB (ref 8.9–10.3)
CO2: 31 mmol/L (ref 22–32)
CREATININE: 0.95 mg/dL (ref 0.61–1.24)
Chloride: 100 mmol/L — ABNORMAL LOW (ref 101–111)
GFR calc non Af Amer: >60 mL/min (ref >60)
GLUCOSE: 156 mg/dL — AB (ref 65–99)
Potassium: 3.8 mmol/L (ref 3.5–5.1)
Sodium: 137 mmol/L (ref 135–145)

## 2016-12-26 LAB — URINALYSIS, COMPLETE (UACMP) WITH MICROSCOPIC
BACTERIA UA: NONE SEEN
Bilirubin Urine: NEGATIVE
Glucose, UA: 50 mg/dL — AB
Hgb urine dipstick: NEGATIVE
Ketones, ur: NEGATIVE mg/dL
Leukocytes, UA: NEGATIVE
NITRITE: NEGATIVE
Protein, ur: NEGATIVE mg/dL
SQUAMOUS EPITHELIAL / LPF: NONE SEEN
Specific Gravity, Urine: 1.014 (ref 1.005–1.030)
pH: 6 (ref 5.0–8.0)

## 2016-12-26 LAB — LACTIC ACID, PLASMA
Lactic Acid, Venous: 1.1 mmol/L (ref 0.5–1.9)
Lactic Acid, Venous: 1.2 mmol/L (ref 0.5–1.9)

## 2016-12-26 LAB — TSH: TSH: 3.926 u[IU]/mL (ref 0.350–4.500)

## 2016-12-26 LAB — CK: Total CK: 70 U/L (ref 49–397)

## 2016-12-26 LAB — MRSA PCR SCREENING: MRSA by PCR: NEGATIVE

## 2016-12-26 LAB — GLUCOSE, CAPILLARY: Glucose-Capillary: 118 mg/dL — ABNORMAL HIGH (ref 65–99)

## 2016-12-26 LAB — PROCALCITONIN: Procalcitonin: <0.1 ng/mL

## 2016-12-26 LAB — TROPONIN I: Troponin I: <0.03 ng/mL (ref <0.03)

## 2016-12-26 MED ORDER — SODIUM CHLORIDE 0.9 % IV SOLN
1250.0000 mg | Freq: Once | INTRAVENOUS | Status: AC
  Administered 2016-12-26: 1250 mg via INTRAVENOUS
  Filled 2016-12-26: qty 1250

## 2016-12-26 MED ORDER — PIPERACILLIN-TAZOBACTAM 3.375 G IVPB
3.3750 g | Freq: Three times a day (TID) | INTRAVENOUS | Status: DC
  Administered 2016-12-26 – 2016-12-27 (×4): 3.375 g via INTRAVENOUS
  Filled 2016-12-26 (×8): qty 50

## 2016-12-26 MED ORDER — ENOXAPARIN SODIUM 40 MG/0.4ML ~~LOC~~ SOLN
40.0000 mg | SUBCUTANEOUS | Status: DC
  Filled 2016-12-26: qty 0.4

## 2016-12-26 MED ORDER — PANTOPRAZOLE SODIUM 40 MG PO TBEC
40.0000 mg | DELAYED_RELEASE_TABLET | Freq: Every day | ORAL | Status: DC
  Administered 2016-12-26 – 2016-12-27 (×2): 40 mg via ORAL
  Filled 2016-12-26 (×2): qty 1

## 2016-12-26 MED ORDER — ACETAMINOPHEN 325 MG PO TABS: 650.0000 mg | ORAL_TABLET | Freq: Four times a day (QID) | ORAL | Status: DC | PRN

## 2016-12-26 MED ORDER — LISINOPRIL 10 MG PO TABS
10.0000 mg | ORAL_TABLET | Freq: Every day | ORAL | Status: DC
  Administered 2016-12-26 – 2016-12-27 (×2): 10 mg via ORAL
  Filled 2016-12-26 (×2): qty 1

## 2016-12-26 MED ORDER — TRAZODONE HCL 100 MG PO TABS
100.0000 mg | ORAL_TABLET | Freq: Every day | ORAL | Status: DC
  Administered 2016-12-26: 100 mg via ORAL
  Filled 2016-12-26: qty 1

## 2016-12-26 MED ORDER — METOPROLOL TARTRATE 25 MG PO TABS: 25.0000 mg | ORAL_TABLET | Freq: Two times a day (BID) | ORAL | Status: DC

## 2016-12-26 MED ORDER — VANCOMYCIN HCL 10 G IV SOLR
1250.0000 mg | INTRAVENOUS | Status: DC
  Administered 2016-12-26: 1250 mg via INTRAVENOUS
  Filled 2016-12-26: qty 1250

## 2016-12-26 MED ORDER — BUSPIRONE HCL 5 MG PO TABS
7.5000 mg | ORAL_TABLET | Freq: Two times a day (BID) | ORAL | Status: DC
Start: 2016-12-26 — End: 2016-12-27
  Administered 2016-12-26 – 2016-12-27 (×4): 7.5 mg via ORAL
  Filled 2016-12-26: qty 2
  Filled 2016-12-26 (×4): qty 1.5
  Filled 2016-12-26: qty 1
  Filled 2016-12-26: qty 1.5

## 2016-12-26 MED ORDER — ONDANSETRON HCL 4 MG/2ML IJ SOLN: 4.0000 mg | Freq: Four times a day (QID) | INTRAMUSCULAR | Status: DC | PRN

## 2016-12-26 MED ORDER — SODIUM CHLORIDE 0.9 % IV SOLN
INTRAVENOUS | Status: DC
  Administered 2016-12-26: 05:00:00 via INTRAVENOUS

## 2016-12-26 MED ORDER — DONEPEZIL HCL 5 MG PO TABS
5.0000 mg | ORAL_TABLET | Freq: Every day | ORAL | Status: DC
  Administered 2016-12-26: 5 mg via ORAL
  Filled 2016-12-26: qty 1

## 2016-12-26 MED ORDER — DOCUSATE SODIUM 100 MG PO CAPS
100.0000 mg | ORAL_CAPSULE | Freq: Two times a day (BID) | ORAL | Status: DC
  Administered 2016-12-26 – 2016-12-27 (×3): 100 mg via ORAL
  Filled 2016-12-26 (×4): qty 1

## 2016-12-26 MED ORDER — ACETAMINOPHEN 650 MG RE SUPP: 650.0000 mg | Freq: Four times a day (QID) | RECTAL | Status: DC | PRN

## 2016-12-26 MED ORDER — ORAL CARE MOUTH RINSE
15.0000 mL | Freq: Two times a day (BID) | OROMUCOSAL | Status: DC
  Administered 2016-12-27: 15 mL via OROMUCOSAL

## 2016-12-26 MED ORDER — ASPIRIN EC 81 MG PO TBEC
81.0000 mg | DELAYED_RELEASE_TABLET | Freq: Every day | ORAL | Status: DC
  Administered 2016-12-26 – 2016-12-27 (×2): 81 mg via ORAL
  Filled 2016-12-26 (×2): qty 1

## 2016-12-26 MED ORDER — ONDANSETRON HCL 4 MG PO TABS: 4.0000 mg | ORAL_TABLET | Freq: Four times a day (QID) | ORAL | Status: DC | PRN

## 2016-12-26 NOTE — Care Management (Signed)
RNCM spoke with [facility] [phone] and patient is from [facility]. CSW updated.

## 2016-12-26 NOTE — ED Provider Notes (Signed)
-----------------------------------------   1:01 AM on 12/26/2016 -----------------------------------------  Patient resting in no acute distress, bair hugger in place. Awaiting labs and urinalysis.  ED ECG REPORT I, Whitney Hillegass J, the attending physician, personally viewed and interpreted this ECG.   Date: 12/26/2016  EKG Time: 0003  Rate: 75  Rhythm: normal EKG, normal sinus rhythm  Axis: Normal  Intervals:none  ST&T Change: Nonspecific  ----------------------------------------- 2:15 AM on 12/26/2016 -----------------------------------------  Patient persistently hypothermic. Otherwise laboratory and urinalysis results are unremarkable. Blood cultures were drawn. Without an obvious infectious source, I will hold antibiotics. Discussed with hospitalist to evaluate patient in the emergency department for admission for hypothermia.    Irean HongSung, Luisantonio Adinolfi J, MD 12/26/16 [id number]

## 2016-12-26 NOTE — H&P (Signed)
Edward Kim is an 83 y.o. male.   Chief Complaint: Fall HPI: The patient with past medical history of dementia and hypertension presents to the emergency department after an unwitnessed fall. The patient was found conscious beside his wheelchair in his nursing home. He does not consistently communicate verbally and thus cannot contribute to his own history. CT of the head and the urgency department did not show acute intracranial injury. The patient appeared stable except for persistent hyperthermia. He was placed on warming blanket. Laboratory evaluation revealed leukopenia which is a change from previous laboratory values prompted the emergency department staff called the hospitalist service for admission.  Past Medical History:  Diagnosis Date  . Anemia   . CKD (chronic kidney disease), stage III   . Dementia   . Diabetes mellitus without complication ([place])   . HLD (hyperlipidemia)   . HTN (hypertension)     History reviewed. No pertinent surgical history. Cannot obtain as patient is non-verbal and family cannot be reached at this time  Family History  Problem Relation Age of Onset  . Family history unknown: Yes   Social History:  reports that he has never smoked. He has never used smokeless tobacco. He reports that he does not drink alcohol. His drug history is not on file.  Allergies: No Known Allergies  Medications Prior to Admission  Medication Sig Dispense Refill  . busPIRone (BUSPAR) 7.5 MG tablet Take 7.5 mg by mouth 2 (two) times daily.    . Loperamide HCl (IMODIUM A-D) 1 MG/7.5ML LIQD Take 15 mLs by mouth as needed.    . magnesium hydroxide (MILK OF MAGNESIA) 400 MG/5ML suspension Take 30 mLs by mouth daily as needed for mild constipation.    . ondansetron (ZOFRAN) 4 MG tablet Take 4 mg by mouth every 8 (eight) hours as needed for nausea or vomiting.    . pantoprazole (PROTONIX) 40 MG tablet Take 40 mg by mouth daily.    . quinapril (ACCUPRIL) 10 MG tablet Take 10 mg by  mouth daily.    . traZODone (DESYREL) 100 MG tablet Take 100 mg by mouth at bedtime.    . Vitamin D, Ergocalciferol, (DRISDOL) 50000 units CAPS capsule Take 50,000 Units by mouth every 30 (thirty) days.      Results for orders placed or performed during the hospital encounter of 12/25/16 (from the past 48 hour(s))  CBC with Differential     Status: Abnormal   Collection Time: 12/25/16 11:43 PM  Result Value Ref Range   WBC 3.7 (L) 3.8 - 10.6 K/uL   RBC 4.24 (L) 4.40 - 5.90 MIL/uL   Hemoglobin 12.3 (L) 13.0 - 18.0 g/dL   HCT 37.1 (L) 40.0 - 52.0 %   MCV 87.7 80.0 - 100.0 fL   MCH 29.0 26.0 - 34.0 pg   MCHC 33.1 32.0 - 36.0 g/dL   RDW 13.7 11.5 - 14.5 %   Platelets 168 150 - 440 K/uL   Neutrophils Relative % 39 %   Neutro Abs 1.4 1.4 - 6.5 K/uL   Lymphocytes Relative 49 %   Lymphs Abs 1.8 1.0 - 3.6 K/uL   Monocytes Relative 9 %   Monocytes Absolute 0.3 0.2 - 1.0 K/uL   Eosinophils Relative 2 %   Eosinophils Absolute 0.1 0 - 0.7 K/uL   Basophils Relative 1 %   Basophils Absolute 0.0 0 - 0.1 K/uL  Basic metabolic panel     Status: Abnormal   Collection Time: 12/25/16 11:43 PM  Result  Value Ref Range   Sodium 137 135 - 145 mmol/L   Potassium 3.8 3.5 - 5.1 mmol/L   Chloride 100 (L) 101 - 111 mmol/L   CO2 31 22 - 32 mmol/L   Glucose, Bld 156 (H) 65 - 99 mg/dL   BUN 14 6 - 20 mg/dL   Creatinine, Ser 0.95 0.61 - 1.24 mg/dL   Calcium 8.7 (L) 8.9 - 10.3 mg/dL   GFR calc non Af Amer >60 >60 mL/min   GFR calc Af Amer >60 >60 mL/min    Comment: (NOTE) The eGFR has been calculated using the CKD EPI equation. This calculation has not been validated in all clinical situations. eGFR's persistently <60 mL/min signify possible Chronic Kidney Disease.    Anion gap 6 5 - 15  Urinalysis, Complete w Microscopic     Status: Abnormal   Collection Time: 12/25/16 11:43 PM  Result Value Ref Range   Color, Urine YELLOW (A) YELLOW   APPearance CLEAR (A) CLEAR   Specific Gravity, Urine 1.014  1.005 - 1.030   pH 6.0 5.0 - 8.0   Glucose, UA 50 (A) NEGATIVE mg/dL   Hgb urine dipstick NEGATIVE NEGATIVE   Bilirubin Urine NEGATIVE NEGATIVE   Ketones, ur NEGATIVE NEGATIVE mg/dL   Protein, ur NEGATIVE NEGATIVE mg/dL   Nitrite NEGATIVE NEGATIVE   Leukocytes, UA NEGATIVE NEGATIVE   RBC / HPF 0-5 0 - 5 RBC/hpf   WBC, UA 0-5 0 - 5 WBC/hpf   Bacteria, UA NONE SEEN NONE SEEN   Squamous Epithelial / LPF NONE SEEN NONE SEEN   Mucous PRESENT   Lactic acid, plasma     Status: None   Collection Time: 12/25/16 11:43 PM  Result Value Ref Range   Lactic Acid, Venous 1.2 0.5 - 1.9 mmol/L  Troponin I     Status: None   Collection Time: 12/25/16 11:43 PM  Result Value Ref Range   Troponin I <0.03 <0.03 ng/mL  CK     Status: None   Collection Time: 12/25/16 11:43 PM  Result Value Ref Range   Total CK 70 49 - 397 U/L  Lactic acid, plasma     Status: None   Collection Time: 12/26/16  3:15 AM  Result Value Ref Range   Lactic Acid, Venous 1.1 0.5 - 1.9 mmol/L  TSH     Status: None   Collection Time: 12/26/16  3:15 AM  Result Value Ref Range   TSH 3.926 0.350 - 4.500 uIU/mL    Comment: Performed by a 3rd Generation assay with a functional sensitivity of <=0.01 uIU/mL.  Procalcitonin - Baseline     Status: None   Collection Time: 12/26/16  3:15 AM  Result Value Ref Range   Procalcitonin <0.10 ng/mL    Comment:        Interpretation: PCT (Procalcitonin) <= 0.5 ng/mL: Systemic infection (sepsis) is not likely. Local bacterial infection is possible. (NOTE)         ICU PCT Algorithm               Non ICU PCT Algorithm    ----------------------------     ------------------------------         PCT < 0.25 ng/mL                 PCT < 0.1 ng/mL     Stopping of antibiotics            Stopping of antibiotics       strongly encouraged.  strongly encouraged.    ----------------------------     ------------------------------       PCT level decrease by               PCT < 0.25 ng/mL        >= 80% from peak PCT       OR PCT 0.25 - 0.5 ng/mL          Stopping of antibiotics                                             encouraged.     Stopping of antibiotics           encouraged.    ----------------------------     ------------------------------       PCT level decrease by              PCT >= 0.25 ng/mL       < 80% from peak PCT        AND PCT >= 0.5 ng/mL            Continuin g antibiotics                                              encouraged.       Continuing antibiotics            encouraged.    ----------------------------     ------------------------------     PCT level increase compared          PCT > 0.5 ng/mL         with peak PCT AND          PCT >= 0.5 ng/mL             Escalation of antibiotics                                          strongly encouraged.      Escalation of antibiotics        strongly encouraged.   Glucose, capillary     Status: Abnormal   Collection Time: 12/26/16  4:29 AM  Result Value Ref Range   Glucose-Capillary 118 (H) 65 - 99 mg/dL   Dg Chest 1 View  Result Date: 12/25/2016 CLINICAL DATA:  Unwitnessed fall EXAM: CHEST 1 VIEW COMPARISON:  11/01/2015 FINDINGS: Stable borderline cardiomegaly. The thoracic aorta is tortuous in appearance with atherosclerosis at the arch. There is bibasilar atelectasis. Otherwise, the lungs are clear. Osteoarthritis of the glenohumeral and AC joints. No acute osseous abnormality. IMPRESSION: No active disease. Electronically Signed   By: Ashley Royalty M.D.   On: 12/25/2016 23:21   Dg Pelvis 1-2 Views  Result Date: 12/25/2016 CLINICAL DATA:  Unwitnessed fall. Bump over the left hip, nontender to palpation. EXAM: PELVIS - 1-2 VIEW COMPARISON:  None. FINDINGS: There is no evidence of acute pelvic fracture or diastasis. Osteoarthritic subchondral sclerosis and cystic change noted of both acetabular components. Mild joint space narrowing of both hips. There is lower lumbar degenerative facet arthropathy at L5-S1  bilaterally. SI joint osteoarthritis with sclerosis and joint space narrowing is seen bilaterally. No pelvic  bone lesions are seen. IMPRESSION: No acute osseous abnormality of the pelvis and hips. Electronically Signed   By: Ashley Royalty M.D.   On: 12/25/2016 23:23   Ct Head Wo Contrast  Result Date: 12/25/2016 CLINICAL DATA:  Dementia patient post unwitnessed fall. EXAM: CT HEAD WITHOUT CONTRAST TECHNIQUE: Contiguous axial images were obtained from the base of the skull through the vertex without intravenous contrast. COMPARISON:  Head CT 09/05/2016 FINDINGS: Brain: No intracranial hemorrhage. No subdural or extra-axial fluid collection. Stable degree of atrophy and chronic small vessel ischemia. No mass effect or midline shift. No evidence of acute infarct. Vascular: Atherosclerosis of skullbase vasculature without hyperdense vessel or abnormal calcification. Skull: No skull fracture.  No focal lesion. Sinuses/Orbits: Paranasal sinuses and mastoid air cells are clear. The visualized orbits are unremarkable. Presumed bilateral cataract resection. Other: None. IMPRESSION: No acute intracranial abnormality. Stable atrophy and chronic small vessel ischemia. Electronically Signed   By: Jeb Levering M.D.   On: 12/25/2016 22:59    Review of Systems  Unable to perform ROS: Dementia    Blood pressure (!) 176/46, pulse 90, temperature (!) 96.6 F (35.9 C), resp. rate 19, weight 77.9 kg (171 lb 11.8 oz), SpO2 98 %. Physical Exam  Constitutional: He appears well-developed and well-nourished. No distress.  HENT:  Head: Normocephalic and atraumatic.  Mouth/Throat: Oropharynx is clear and moist.  Eyes: Conjunctivae and EOM are normal. Pupils are equal, round, and reactive to light. No scleral icterus.  Neck: Normal range of motion. Neck supple. No JVD present. No tracheal deviation present. No thyromegaly present.  Cardiovascular: Normal rate and regular rhythm.  Exam reveals no gallop and no friction rub.    Murmur heard. Respiratory: Effort normal and breath sounds normal. No respiratory distress. He has no wheezes.  GI: Soft. Bowel sounds are normal. He exhibits no distension. There is no tenderness.  Genitourinary:  Genitourinary Comments: Deferred  Musculoskeletal: Normal range of motion. He exhibits no edema.  Lymphadenopathy:    He has no cervical adenopathy.  Neurological: He is alert. No cranial nerve deficit.  Patient is not very communicative; will not answer to orientation question  Skin: Skin is warm and dry. No rash noted. No erythema.  Psychiatric: He has a normal mood and affect. His behavior is normal. Judgment and thought content normal.     Assessment/Plan This is a 83 year old male admitted for sepsis. 1. Sepsis: The patient criteria via hypothymia and leukopenia. The latter can be present in the elderly or immunocompromised, however the patient has had normal blood counts in the past. Anemia is listed as part of his past medical history however he is only mildly anemic and certainly not dramatically so since his previous CBC at which time white blood cell count was normal. Blood cultures have been obtained in the emergency department I started the patient on broad-spectrum antibiotics. Blood pressure is normal. Lactic acid is normal. Obtain pro-calcitonin. Follow blood cultures were growth and sensitivities. 2. Hypertension: Controlled; continue ACE inhibitor 3. Dementia: Stable. I elected keep the patient on Aricept although he may not have been receiving this at his nursing home. 4. Fall: Exercise fall precautions in the hospital. 5. DVT prophylaxis: Lanoxin 6. GI prophylaxis: None The patient is a full code. Time spent on admission orders and critical care approximately 45 minutes  Harrie Foreman, MD 12/26/2016, 4:59 AM

## 2016-12-26 NOTE — Progress Notes (Signed)
Pharmacy Antibiotic Note  Edward Kim is a 83 y.o. male admitted on 12/25/2016 with sepsis.  Pharmacy has been consulted for vancomycin and Zosyn dosing.  Plan: DW 86kg  Vd 60L kei 0.053 hr-1  t1/2 14 hours Vancomycin 1250 mg q 18 hours ordered with stacked dosing. Level before 5th dose. Goal trough 15-20.  Zosyn 3.375 grams q 8 hours ordered.  Weight: 190 lb (86.2 kg)  Temp (24hrs), Avg:94 F (34.4 C), Min:93.5 F (34.2 C), Max:95 F (35 C)   Recent Labs Lab 12/25/16 2343  WBC 3.7*  CREATININE 0.95  LATICACIDVEN 1.2    Estimated Creatinine Clearance: 57.7 mL/min (by C-G formula based on SCr of 0.95 mg/dL).    No Known Allergies  Antimicrobials this admission: vancomycin Zosyn 5/17 >>    >>   Dose adjustments this admission:   Microbiology results: 5/16 BCx: pending 5/16 UCx: pending  5/17 MRSA PCR: pending      5/16 UA: (-) 5/16 CXR: no active disease  Thank you for allowing pharmacy to be a part of this patient's care.  Zafirah Vanzee S 12/26/2016 3:27 AM

## 2016-12-26 NOTE — Progress Notes (Signed)
Pt presents to ICU very uncooperative. Does not answer questions and does not want to be touched. Per report alert only to self, temp up to 96.6.No family or POA present at this time.

## 2016-12-26 NOTE — ED Notes (Signed)
Bair Hugger in place per Dr. Dolores FrameSung request to treat pt low temp. Pt family at the bedside. Tolerating well.

## 2016-12-26 NOTE — Progress Notes (Signed)
[facility] [place] [facility]   PATIENT NAME: Edward Kim    MR#:  [id number]  DATE OF BIRTH:  10/09/1921  SUBJECTIVE:  CHIEF COMPLAINT:   Chief Complaint  Patient presents with  . Fall  Hypothermia resolved, pt confused/sleepy/demented  REVIEW OF SYSTEMS:  Review of Systems  Unable to perform ROS: Dementia    DRUG ALLERGIES:  No Known Allergies VITALS:  Blood pressure 121/66, pulse 89, temperature 98.6 F (37 C), resp. rate 20, height 6' (1.829 m), weight 79.9 kg (176 lb 2.4 oz), SpO2 100 %. PHYSICAL EXAMINATION:  Physical Exam  Constitutional: He is well-developed, well-nourished, and in no distress.  HENT:  Head: Normocephalic and atraumatic.  Eyes: Conjunctivae and EOM are normal. Pupils are equal, round, and reactive to light.  Neck: Normal range of motion. Neck supple. No tracheal deviation present. No thyromegaly present.  Cardiovascular: Normal rate, regular rhythm and normal heart sounds.   Pulmonary/Chest: Effort normal and breath sounds normal. No respiratory distress. He has no wheezes. He exhibits no tenderness.  Abdominal: Soft. Bowel sounds are normal. He exhibits no distension. There is no tenderness.  Musculoskeletal: Normal range of motion.  Neurological: He is disoriented. No cranial nerve deficit.  demented  Skin: Skin is warm and dry. No rash noted.  Psychiatric:  Demented so difficult to assess   LABORATORY PANEL:  Male CBC  Recent Labs Lab 12/25/16 2343  WBC 3.7*  HGB 12.3*  HCT 37.1*  PLT 168   ------------------------------------------------------------------------------------------------------------------ Chemistries   Recent Labs Lab 12/25/16 2343  NA 137  K 3.8  CL 100*  CO2 31  GLUCOSE 156*  BUN 14  CREATININE 0.95  CALCIUM 8.7*   RADIOLOGY:  Dg Chest 1 View  Result Date: 12/25/2016 CLINICAL DATA:  Unwitnessed fall EXAM: CHEST 1 VIEW COMPARISON:  11/01/2015 FINDINGS: Stable borderline  cardiomegaly. The thoracic aorta is tortuous in appearance with atherosclerosis at the arch. There is bibasilar atelectasis. Otherwise, the lungs are clear. Osteoarthritis of the glenohumeral and AC joints. No acute osseous abnormality. IMPRESSION: No active disease. Electronically Signed   By: Tollie Ethavid  Kwon M.D.   On: 12/25/2016 23:21   Dg Pelvis 1-2 Views  Result Date: 12/25/2016 CLINICAL DATA:  Unwitnessed fall. Bump over the left hip, nontender to palpation. EXAM: PELVIS - 1-2 VIEW COMPARISON:  None. FINDINGS: There is no evidence of acute pelvic fracture or diastasis. Osteoarthritic subchondral sclerosis and cystic change noted of both acetabular components. Mild joint space narrowing of both hips. There is lower lumbar degenerative facet arthropathy at L5-S1 bilaterally. SI joint osteoarthritis with sclerosis and joint space narrowing is seen bilaterally. No pelvic bone lesions are seen. IMPRESSION: No acute osseous abnormality of the pelvis and hips. Electronically Signed   By: Tollie Ethavid  Kwon M.D.   On: 12/25/2016 23:23   Ct Head Wo Contrast  Result Date: 12/25/2016 CLINICAL DATA:  Dementia patient post unwitnessed fall. EXAM: CT HEAD WITHOUT CONTRAST TECHNIQUE: Contiguous axial images were obtained from the base of the skull through the vertex without intravenous contrast. COMPARISON:  Head CT 09/05/2016 FINDINGS: Brain: No intracranial hemorrhage. No subdural or extra-axial fluid collection. Stable degree of atrophy and chronic small vessel ischemia. No mass effect or midline shift. No evidence of acute infarct. Vascular: Atherosclerosis of skullbase vasculature without hyperdense vessel or abnormal calcification. Skull: No skull fracture.  No focal lesion. Sinuses/Orbits: Paranasal sinuses and mastoid air cells are clear. The visualized orbits are unremarkable. Presumed bilateral cataract resection. Other: None. IMPRESSION:  No acute intracranial abnormality. Stable atrophy and chronic small vessel  ischemia. Electronically Signed   By: Rubye Oaks M.D.   On: 12/25/2016 22:59   ASSESSMENT AND PLAN:  This is a 83 year old male admitted for hypothermia.  1. Sepsis: ruled out. Hypothermia: temp curve improved with bear hugger 2. Hypertension: Controlled; continue ACE inhibitor 3. Dementia: Stable. I elected keep the patient on Aricept although he may not have been receiving this at his nursing home. 4. Fall: Exercise fall precautions in the hospital. 5. DVT prophylaxis: Lanoxin 6. GI prophylaxis: None   D/w daughter at bedside  Palliative care c/s  All the records are reviewed and case discussed with Care Management/Social Worker. Management plans discussed with the patient, family and they are in agreement.  CODE STATUS: Full Code  TOTAL TIME TAKING CARE OF THIS PATIENT: 35 minutes.   More than 50% of the time was spent in counseling/coordination of care: YES  POSSIBLE D/C IN 1-2 DAYS, DEPENDING ON CLINICAL CONDITION.   Delfino Lovett M.D on 12/26/2016 at 9:14 PM  Between 7am to 6pm - Pager - [phone]  After 6pm go to [email or web address] - Social research officer, government  [facility] [place] [facility]  Office  [phone]  CC: Primary care physician; Patient, No Pcp Per  Note: This dictation was prepared with Dragon dictation along with smaller phrase technology. Any transcriptional errors that result from this process are unintentional.

## 2016-12-26 NOTE — Progress Notes (Signed)
Name: Edward Kim MRN: [id number] DOB: 1922/08/01    ADMISSION DATE:  12/25/2016  CHIEF COMPLAINT: " Falls"  BRIEF PATIENT DESCRIPTION:  83 year old admitted to ICU after an unwittenessed fall . Patient is hypothermic as well as leukopenic however source of infection is unclear.  SIGNIFICANT EVENTS  5/17>> Patient admitted to the ICU with Hypothermia,no active source of infection.  On Bear Hugger  STUDIES:  3/23 ECHO>>Left ventricle: The cavity size was normal. There was moderate  concentric hypertrophy. Systolic function was vigorous. The  estimated ejection fraction was 65%. 5/16 CT head>>No acute intracranial abnormality. Stable atrophy and chronic smallvessel ischemia 5/16 DG pelvis>>No acute osseous abnormality of the pelvis and hips   HISTORY OF PRESENT ILLNESS:  Edward Kim  Is a 83 year old male resident of memory care unit at [facility] with known history of  Stage- IIICKD, Dementia, DM, HTN  And hyperlipidemia.  Patient had an unwitnessed fall and was brought to [facility].  Patient was noted to be hypothermic and leukopenic. However procalcitonin is <0.10 and lactic acid- 1.1. Patient was admitted by the hospitalist team and was sent to the ICU.  PAST MEDICAL HISTORY :   has a past medical history of Anemia; CKD (chronic kidney disease), stage III; Dementia; Diabetes mellitus without complication (HCC); HLD (hyperlipidemia); and HTN (hypertension).  has no past surgical history on file. Prior to Admission medications   Medication Sig Start Date End Date Taking? Authorizing Provider  busPIRone (BUSPAR) 7.5 MG tablet Take 7.5 mg by mouth 2 (two) times daily.   Yes [provider]  Loperamide HCl (IMODIUM A-D) 1 MG/7.5ML LIQD Take 15 mLs by mouth as needed.   Yes [provider]  magnesium hydroxide (MILK OF MAGNESIA) 400 MG/5ML suspension Take 30 mLs by mouth daily as needed for mild constipation.   Yes [provider]  ondansetron (ZOFRAN) 4 MG  tablet Take 4 mg by mouth every 8 (eight) hours as needed for nausea or vomiting.   Yes [provider]  pantoprazole (PROTONIX) 40 MG tablet Take 40 mg by mouth daily.   Yes [provider]  quinapril (ACCUPRIL) 10 MG tablet Take 10 mg by mouth daily.   Yes [provider]  traZODone (DESYREL) 100 MG tablet Take 100 mg by mouth at bedtime.   Yes [provider]  Vitamin D, Ergocalciferol, (DRISDOL) 50000 units CAPS capsule Take 50,000 Units by mouth every 30 (thirty) days.   Yes [provider]   No Known Allergies  FAMILY HISTORY:  Family history is unknown by patient. SOCIAL HISTORY:  reports that he has never smoked. He has never used smokeless tobacco. He reports that he does not drink alcohol.  REVIEW OF SYSTEMS:   Constitutional: Negative for fever, chills, weight loss, malaise/fatigue and diaphoresis.  HENT: Negative for hearing loss, ear pain, nosebleeds, congestion, sore throat, neck pain, tinnitus and ear discharge.   Eyes: Negative for blurred vision, double vision, photophobia, pain, discharge and redness.  Respiratory: Negative for cough, hemoptysis, sputum production, shortness of breath, wheezing and stridor.   Cardiovascular: Negative for chest pain, palpitations, orthopnea, claudication, leg swelling and PND.  Gastrointestinal: Negative for heartburn, nausea, vomiting, abdominal pain, diarrhea, constipation, blood in stool and melena.  Genitourinary: Negative for dysuria, urgency, frequency, hematuria and flank pain.  Musculoskeletal: Negative for myalgias, back pain, joint pain and falls.  Skin: Negative for itching and rash.  Neurological: Negative for dizziness, tingling, tremors, sensory change, speech change, focal weakness, seizures,  loss of consciousness, weakness and headaches.  Endo/Heme/Allergies: Negative for environmental allergies and polydipsia. Does not bruise/bleed easily.  SUBJECTIVE: Patient is dementic and  mumbled a few words.  VITAL SIGNS: Temp:  [93.5 F (34.2 C)-95 F (35 C)] 95 F (35 C) (05/17 0324) Pulse Rate:  [67-82] 82 (05/17 0300) Resp:  [16-20] 16 (05/17 0300) BP: (103-163)/(61-87) 123/62 (05/17 0300) SpO2:  [100 %] 100 % (05/17 0300) Weight:  [171 lb 11.8 oz (77.9 kg)-190 lb (86.2 kg)] 171 lb 11.8 oz (77.9 kg) (05/17 0445)  PHYSICAL EXAMINATION: General:  Elderly AA male, on RA Neuro:  Dementic HEENT:  AT,[place],No JVD Cardiovascular:  S1S2, Regular, no m/r/g noted Lungs:  Clear bilaterally, no wheezes, crackles, rhonchi noted Abdomen: soft , NT, ND, +BS Musculoskeletal:  No edema, cyanosis noted Skin:  Warm, dry and intact   Recent Labs Lab 12/25/16 2343  NA 137  K 3.8  CL 100*  CO2 31  BUN 14  CREATININE 0.95  GLUCOSE 156*    Recent Labs Lab 12/25/16 2343  HGB 12.3*  HCT 37.1*  WBC 3.7*  PLT 168   Dg Chest 1 View  Result Date: 12/25/2016 CLINICAL DATA:  Unwitnessed fall EXAM: CHEST 1 VIEW COMPARISON:  11/01/2015 FINDINGS: Stable borderline cardiomegaly. The thoracic aorta is tortuous in appearance with atherosclerosis at the arch. There is bibasilar atelectasis. Otherwise, the lungs are clear. Osteoarthritis of the glenohumeral and AC joints. No acute osseous abnormality. IMPRESSION: No active disease. Electronically Signed   By: Tollie Eth M.D.   On: 12/25/2016 23:21   Dg Pelvis 1-2 Views  Result Date: 12/25/2016 CLINICAL DATA:  Unwitnessed fall. Bump over the left hip, nontender to palpation. EXAM: PELVIS - 1-2 VIEW COMPARISON:  None. FINDINGS: There is no evidence of acute pelvic fracture or diastasis. Osteoarthritic subchondral sclerosis and cystic change noted of both acetabular components. Mild joint space narrowing of both hips. There is lower lumbar degenerative facet arthropathy at L5-S1 bilaterally. SI joint osteoarthritis with sclerosis and joint space narrowing is seen bilaterally. No pelvic bone lesions are seen. IMPRESSION: No acute osseous  abnormality of the pelvis and hips. Electronically Signed   By: Tollie Eth M.D.   On: 12/25/2016 23:23   Ct Head Wo Contrast  Result Date: 12/25/2016 CLINICAL DATA:  Dementia patient post unwitnessed fall. EXAM: CT HEAD WITHOUT CONTRAST TECHNIQUE: Contiguous axial images were obtained from the base of the skull through the vertex without intravenous contrast. COMPARISON:  Head CT 09/05/2016 FINDINGS: Brain: No intracranial hemorrhage. No subdural or extra-axial fluid collection. Stable degree of atrophy and chronic small vessel ischemia. No mass effect or midline shift. No evidence of acute infarct. Vascular: Atherosclerosis of skullbase vasculature without hyperdense vessel or abnormal calcification. Skull: No skull fracture.  No focal lesion. Sinuses/Orbits: Paranasal sinuses and mastoid air cells are clear. The visualized orbits are unremarkable. Presumed bilateral cataract resection. Other: None. IMPRESSION: No acute intracranial abnormality. Stable atrophy and chronic small vessel ischemia. Electronically Signed   By: Rubye Oaks M.D.   On: 12/25/2016 22:59    ASSESSMENT / PLAN:  ?sepsis  is highly unlikely with unclear etiology Hypothermia Leukopenia Hx of HTN Hx of Dementia Hx of Stage-III CKD   Plan Patient does not appear to be septic  As  PCT<0.10, LA-1.1,will discontinue antibiotics with in 24-48 hours. Continue warming blanketr to achieve the target temperature Continue Lisinopril Resume home meds Follow Cultures .Protective precautions Monitor fever,CBC    Bincy Varughese,AG-ACNP Pulmonary and Critical Care Medicine [facility]   12/26/2016, 4:48 AM  Pt seen and examined, agree with NP findings, assessment, plan. Pt found down after a fall, he was found down out of his wheelchair, down time is unknown. He was has baseline dementia and can not provide a history or ROS at this time. I personally review his CXR which was unremarkable. Of note he was hypothermia,  therefore he was admitted to ICU for rewarming. His labs were WNL, TSH normal and no signs of infection, cultures are pending.   Will continue rewarming as needed.  Wells Guiles-Deep Hanh Kertesz, M.D.  12/26/2016

## 2016-12-27 LAB — BASIC METABOLIC PANEL
ANION GAP: 7 (ref 5–15)
BUN: 13 mg/dL (ref 6–20)
CO2: 31 mmol/L (ref 22–32)
Calcium: 8.8 mg/dL — ABNORMAL LOW (ref 8.9–10.3)
Chloride: 100 mmol/L — ABNORMAL LOW (ref 101–111)
Creatinine, Ser: 1 mg/dL (ref 0.61–1.24)
Glucose, Bld: 116 mg/dL — ABNORMAL HIGH (ref 65–99)
POTASSIUM: 4 mmol/L (ref 3.5–5.1)
SODIUM: 138 mmol/L (ref 135–145)

## 2016-12-27 LAB — CBC
HCT: 37.1 % — ABNORMAL LOW (ref 40.0–52.0)
Hemoglobin: 12.3 g/dL — ABNORMAL LOW (ref 13.0–18.0)
MCH: 29.6 pg (ref 26.0–34.0)
MCHC: 33.2 g/dL (ref 32.0–36.0)
MCV: 88.9 fL (ref 80.0–100.0)
PLATELETS: 158 10*3/uL (ref 150–440)
RBC: 4.18 MIL/uL — AB (ref 4.40–5.90)
RDW: 13.9 % (ref 11.5–14.5)
WBC: 4.8 10*3/uL (ref 3.8–10.6)

## 2016-12-27 LAB — URINE CULTURE

## 2016-12-27 LAB — PROCALCITONIN: Procalcitonin: <0.1 ng/mL

## 2016-12-27 LAB — HEMOGLOBIN A1C
Hgb A1c MFr Bld: 7.3 % — ABNORMAL HIGH (ref 4.8–5.6)
Mean Plasma Glucose: 163 mg/dL

## 2016-12-27 NOTE — Progress Notes (Signed)
Pt is being discharged today. IV x1 removed. All belongings packed and returned to the patient. [facility] as well as to [facility]. [facility] has picked up the patient.

## 2016-12-27 NOTE — Care Management (Signed)
Patient discharging to SNF.  CSW facilitating.  RNCM signing off.  

## 2016-12-27 NOTE — Clinical Social Work Note (Signed)
Pt is ready for discharge today and will to [facility]. Pt was admitted from [facility]. Pt is also followed by [facility]. CSW spoke with [facility] CSW and the plan is to transition to [facility] for a higher level of care. CSW updated MD.  Pt's POA/Neice, Oretha Capricemetta Corbett, is aware and agreeable to discharge plan. [facility] will provide transportation. RN will call report to [facility] and [facility]. CSW is signing off as no further needs identified.   Dede QuerySarah Andrya Roppolo, MSW, LCSW  Clinical Social Worker  [phone]

## 2016-12-27 NOTE — Progress Notes (Signed)
Pharmacy Antibiotic Note  Edward Kim is a 83 y.o. male admitted on 12/25/2016 with sepsis.  Pharmacy has been consulted for Zosyn dosing.  Plan: Zosyn 3.375 grams q 8 hours ordered.  Height: 6' (182.9 cm) Weight: 177 lb 1.6 oz (80.3 kg) IBW/kg (Calculated) : 77.6  Temp (24hrs), Avg:97.2 F (36.2 C), Min:96.1 F (35.6 C), Max:98.6 F (37 C)   Recent Labs Lab 12/25/16 2343 12/26/16 0315 12/27/16 0500  WBC 3.7*  --  4.8  CREATININE 0.95  --  1.00  LATICACIDVEN 1.2 1.1  --     Estimated Creatinine Clearance: 51.7 mL/min (by C-G formula based on SCr of 1 mg/dL).    No Known Allergies  Antimicrobials this admission:  Dose adjustments this admission:   Microbiology results: 5/16 BCx: pending 5/16 UCx: pending  5/17 MRSA PCR: pending      5/16 UA: (-) 5/16 CXR: no active disease  Thank you for allowing pharmacy to be a part of this patient's care.  Gaylynn Seiple D 12/27/2016 9:10 AM

## 2016-12-27 NOTE — Discharge Summary (Signed)
[facility] [place] at [facility]   PATIENT NAME: Edward Kim    MR#:  [id number]  DATE OF BIRTH:  [birth year]  DATE OF ADMISSION:  12/25/2016   ADMITTING PHYSICIAN: Arnaldo NatalMichael S Diamond, MD  DATE OF DISCHARGE: 12/27/2016  PRIMARY CARE PHYSICIAN: [facility]  ADMISSION DIAGNOSIS:  Hypothermia, initial encounter [T68.XXXA] Fall, initial encounter [id number][W19.XXXA] DISCHARGE DIAGNOSIS:  Active Problems:   Sepsis (HCC)  SECONDARY DIAGNOSIS:   Past Medical History:  Diagnosis Date  . Anemia   . CKD (chronic kidney disease), stage III   . Dementia   . Diabetes mellitus without complication (HCC)   . HLD (hyperlipidemia)   . HTN (hypertension)    HOSPITAL COURSE:  This is a 83 year old male admitted for hypothermia.  1. Sepsis: ruled out. Hypothermia: temp curve improved with bear hugger, may be due to him laying on floor for extended period in cold temperature. 2. Hypertension: Controlled; continue ACE inhibitor 3. Dementia: Stable - continue Aricept. 4. Fall: Exercise fall precautions  DISCHARGE CONDITIONS:  stable CONSULTS OBTAINED:   DRUG ALLERGIES:  No Known Allergies DISCHARGE MEDICATIONS:   Allergies as of 12/27/2016   No Known Allergies     Medication List    TAKE these medications   busPIRone 7.5 MG tablet Commonly known as:  BUSPAR Take 7.5 mg by mouth 2 (two) times daily.   IMODIUM A-D 1 MG/7.5ML Liqd Generic drug:  Loperamide HCl Take 15 mLs by mouth as needed.   magnesium hydroxide 400 MG/5ML suspension Commonly known as:  MILK OF MAGNESIA Take 30 mLs by mouth daily as needed for mild constipation.   ondansetron 4 MG tablet Commonly known as:  ZOFRAN Take 4 mg by mouth every 8 (eight) hours as needed for nausea or vomiting.   pantoprazole 40 MG tablet Commonly known as:  PROTONIX Take 40 mg by mouth daily.   quinapril 10 MG tablet Commonly known as:  ACCUPRIL Take 10 mg by mouth daily.   traZODone 100 MG  tablet Commonly known as:  DESYREL Take 100 mg by mouth at bedtime.   Vitamin D (Ergocalciferol) 50000 units Caps capsule Commonly known as:  DRISDOL Take 50,000 Units by mouth every 30 (thirty) days.      DISCHARGE INSTRUCTIONS:   DIET:  Regular diet DISCHARGE CONDITION:  Stable ACTIVITY:  Activity as tolerated OXYGEN:  Home Oxygen: No.  Oxygen Delivery: room air DISCHARGE LOCATION:  nursing home   If you experience worsening of your admission symptoms, develop shortness of breath, life threatening emergency, suicidal or homicidal thoughts you must seek medical attention immediately by calling 911 or calling your MD immediately  if symptoms less severe.  You Must read complete instructions/literature along with all the possible adverse reactions/side effects for all the Medicines you take and that have been prescribed to you. Take any new Medicines after you have completely understood and accpet all the possible adverse reactions/side effects.   Please note  You were cared for by a hospitalist during your hospital stay. If you have any questions about your discharge medications or the care you received while you were in the hospital after you are discharged, you can call the unit and asked to speak with the hospitalist on call if the hospitalist that took care of you is not available. Once you are discharged, your primary care physician will handle any further medical issues. Please note that NO REFILLS for any discharge medications will be authorized once you are discharged, as it is  imperative that you return to your primary care physician (or establish a relationship with a primary care physician if you do not have one) for your aftercare needs so that they can reassess your need for medications and monitor your lab values.    On the day of Discharge:  VITAL SIGNS:  Blood pressure (!) 133/56, pulse 89, temperature 97.9 F (36.6 C), resp. rate 20, height 6' (1.829 m), weight  80.3 kg (177 lb 1.6 oz), SpO2 100 %. PHYSICAL EXAMINATION:  GENERAL:  83 y.o.-year-old patient lying in the bed with no acute distress.  EYES: Pupils equal, round, reactive to light and accommodation. No scleral icterus. Extraocular muscles intact.  HEENT: Head atraumatic, normocephalic. Oropharynx and nasopharynx clear.  NECK:  Supple, no jugular venous distention. No thyroid enlargement, no tenderness.  LUNGS: Normal breath sounds bilaterally, no wheezing, rales,rhonchi or crepitation. No use of accessory muscles of respiration.  CARDIOVASCULAR: S1, S2 normal. No murmurs, rubs, or gallops.  ABDOMEN: Soft, non-tender, non-distended. Bowel sounds present. No organomegaly or mass.  EXTREMITIES: No pedal edema, cyanosis, or clubbing.  NEUROLOGIC: Cranial nerves II through XII are intact. Muscle strength 5/5 in all extremities. Sensation intact. Gait not checked.  PSYCHIATRIC: The patient is alert and oriented x 3.  SKIN: No obvious rash, lesion, or ulcer.  DATA REVIEW:   CBC  Recent Labs Lab 12/27/16 0500  WBC 4.8  HGB 12.3*  HCT 37.1*  PLT 158    Chemistries   Recent Labs Lab 12/27/16 0500  NA 138  K 4.0  CL 100*  CO2 31  GLUCOSE 116*  BUN 13  CREATININE 1.00  CALCIUM 8.8*       Management plans discussed with the patient, family and they are in agreement.  CODE STATUS: Full Code   TOTAL TIME TAKING CARE OF THIS PATIENT: 45 minutes.    Delfino Lovett M.D on 12/27/2016 at 1:31 PM  Between 7am to 6pm - Pager - [phone]  After 6pm go to [email or web address] - Social research officer, government  [facility]  Hospitalists  Office  [phone]  CC: Primary care physician; Patient, No Pcp Per   Note: This dictation was prepared with Dragon dictation along with smaller phrase technology. Any transcriptional errors that result from this process are unintentional.

## 2016-12-27 NOTE — Care Management Important Message (Signed)
Important Message  Patient Details  Name: Edward Kim MRN: [id number] Date of Birth: 1922-05-17   Medicare Important Message Given:  N/A - LOS <3 / Initial given by admissions    Chapman FitchBOWEN, Stark Aguinaga T, RN 12/27/2016, 1:47 PM

## 2016-12-27 NOTE — [place] FL2 (Signed)
[place] MEDICAID FL2 LEVEL OF CARE SCREENING TOOL     IDENTIFICATION  Patient Name: Edward Kim Birthdate: Jun 10, 1922 Sex: male Admission Date (Current Location): 12/25/2016  [place]   Facility and Address:  [facility], [address], [place], [place] [address]      Provider Number: [id number]  Attending Physician Name and Address:  Delfino LovettShah, Vipul, MD  Relative Name and Phone Number:       Current Level of Care: Hospital Recommended Level of Care: Skilled Nursing Facility Prior Approval Number:    Date Approved/Denied:   PASRR Number: [id number]  Discharge Plan: SNF    Current Diagnoses: Patient Active Problem List   Diagnosis Date Noted  . Sepsis (HCC) 12/26/2016  . Influenza 11/01/2015  . Generalized weakness 11/01/2015  . CKD (chronic kidney disease), stage III 11/01/2015  . Diabetes mellitus (HCC) 11/01/2015  . Elevated transaminase level 11/01/2015  . Elevated troponin 11/01/2015    Orientation RESPIRATION BLADDER Height & Weight     Self  Normal Incontinent Weight: 177 lb 1.6 oz (80.3 kg) Height:  6' (182.9 cm)  BEHAVIORAL SYMPTOMS/MOOD NEUROLOGICAL BOWEL NUTRITION STATUS      Continent  (Heart Healthy, Thin Liquids)  AMBULATORY STATUS COMMUNICATION OF NEEDS Skin   Extensive Assist Verbally Normal                       Personal Care Assistance Level of Assistance  Bathing, Feeding, Dressing Bathing Assistance: Limited assistance Feeding assistance: Independent Dressing Assistance: Limited assistance     Functional Limitations Info  Sight, Hearing, Speech Sight Info: Adequate Hearing Info: Adequate Speech Info: Adequate    SPECIAL CARE FACTORS FREQUENCY  PT (By licensed PT)     PT Frequency: 5              Contractures Contractures Info: Not present    Additional Factors Info  Code Status, Allergies, Psychotropic Code Status Info: Full Code Allergies Info: No Known  Allergies Psychotropic Info: Medications:  Buspar         Current Medications (12/27/2016):  This is the current hospital active medication list Current Facility-Administered Medications  Medication Dose Route Frequency Provider Last Rate Last Dose  . acetaminophen (TYLENOL) tablet 650 mg  650 mg Oral Q6H PRN Arnaldo Nataliamond, Michael S, MD       Or  . acetaminophen (TYLENOL) suppository 650 mg  650 mg Rectal Q6H PRN Arnaldo Nataliamond, Michael S, MD      . aspirin EC tablet 81 mg  81 mg Oral Daily Arnaldo Nataliamond, Michael S, MD   81 mg at 12/27/16 [id number]  . busPIRone (BUSPAR) tablet 7.5 mg  7.5 mg Oral BID Arnaldo Nataliamond, Michael S, MD   7.5 mg at 12/27/16 [id number]  . docusate sodium (COLACE) capsule 100 mg  100 mg Oral BID Arnaldo Nataliamond, Michael S, MD   100 mg at 12/27/16 [id number]  . donepezil (ARICEPT) tablet 5 mg  5 mg Oral QHS Arnaldo Nataliamond, Michael S, MD   5 mg at 12/26/16 2207  . enoxaparin (LOVENOX) injection 40 mg  40 mg Subcutaneous Q24H Arnaldo Nataliamond, Michael S, MD      . lisinopril (PRINIVIL,ZESTRIL) tablet 10 mg  10 mg Oral Daily Arnaldo Nataliamond, Michael S, MD   10 mg at 12/27/16 0906  . MEDLINE mouth rinse  15 mL Mouth Rinse BID Arnaldo Nataliamond, Michael S, MD   15 mL at 12/27/16 0906  . ondansetron (ZOFRAN) tablet 4 mg  4 mg Oral Q6H  PRN Arnaldo Natal, MD       Or  . ondansetron [facility]) injection 4 mg  4 mg Intravenous Q6H PRN Arnaldo Natal, MD      . pantoprazole (PROTONIX) EC tablet 40 mg  40 mg Oral Daily Arnaldo Natal, MD   40 mg at 12/27/16 0905  . piperacillin-tazobactam (ZOSYN) IVPB 3.375 g  3.375 g Intravenous Q8H Arnaldo Natal, MD   Stopped at 12/27/16 [id number]  . traZODone (DESYREL) tablet 100 mg  100 mg Oral QHS Arnaldo Natal, MD   100 mg at 12/26/16 2207     Discharge Medications: Please see discharge summary for a list of discharge medications.  Relevant Imaging Results:  Relevant Lab Results:   Additional Information SSN:  [id number]  Dede Query, LCSW

## 2016-12-27 NOTE — Discharge Instructions (Signed)
Hypothermia Prevention Hypothermia is a drop in body temperature to 62F (35C) or lower. It is usually caused by exposure to cold temperatures, but it can also be caused by diseases that alter the body's temperature. What are some ways to prevent hypothermia?  Do not underestimate the cold. Most cases of hypothermia develop at 30-64F (1-10C).  Do not ignore shivering. Persistent or violent shivering is a warning that your temperature is dropping and that you may be on the edge of getting hypothermia.  Dress appropriately for the cold:  Make sure your face, ears, and neck are covered before going into cold weather. [place] with hoods that extend several inches beyond your face are great in protecting against wind and blowing snow.  Wear a pair of cotton socks under a pair of wool socks to increase insulation and to take the sweat away from your feet.  Wear layers. If you begin to sweat, take a layer off so that your clothing does not become wet.  Stay dry. If any of your clothing gets wet, replace it right away. Your body loses heat more quickly if your clothes are wet.  Stay out of the wind.  Drink enough fluid to keep your urine clear or pale yellow.  Do not skip meals.  Do not drink alcohol if you are going to be outside in cold weather, if you are going boating, or before going to bed on cold nights. Alcohol causes your body to lose heat. What are some ways to prevent hypothermia in water? Water does not need to be extremely cold to cause hypothermia. Water at any temperature colder than your normal body temperature will cause you to lose heat. If you fall into cold water:  Get out of the water right away. If you cannot get out of the water, get as much of yourself out as possible by climbing onto anything floating nearby, such as an overturned boat.  Do not attempt to swim unless land, a boat, another person, or a life jacket is close by. Swimming uses up energy and may shorten  survival time.  Do not remove your clothing until you are safely out of the water and can get dry or warm. When in the water, buckle, button, and zip up your clothes. The layer of water between your clothing and your body will help insulate you and keep you warm.  Hold your knees to your chest. The knee-to-chest position minimizes heat loss.  If you have fallen into the water with other people, face each other, create a tight circle, and huddle together. Remember to wear a life jacket if you plan to ride in any watercraft. A life jacket can keep you alive longer in cold water by helping you float without using energy and by providing insulation to reduce heat loss. What are some ways to prevent hypothermia if you are stranded in your car? Keep emergency supplies such as blankets, gloves, and a change of clothes in your car. If you get stranded in your car, cover yourself with the blankets or clothing. If there are others in the car with you, huddle together. Run the car for 10 minutes each hour to warm it up. Make sure a window is slightly open and the exhaust pipe is not covered with snow while the engine is running. What are some ways to prevent hypothermia in children? Children lose heat faster than adults do. Accidental hypothermia can occur even with temperatures of 60-4F (15.6-18.3C), particularly among infants.  Infants  prevent hypothermia in children?  Children lose heat faster than adults do. Accidental hypothermia can occur even with temperatures of 60-65°F (15.6-18.3°C), particularly among infants.  · Infants less than one year of age should never sleep in a cold room. They should be provided with warm clothing and a wearable blanket to prevent loss of body heat.  · To help prevent hypothermia when children are outside in the winter:  ? Dress infants and young children in one more layer than an adult would wear in the same conditions.  ? Limit the amount of time children spend outside in the cold.  ? Have children come inside often to warm up.    This information is not intended to replace advice given to you by your health care provider. Make sure you discuss any questions you  have with your health care provider.  Document Released: 07/26/2000 Document Revised: 01/04/2016 Document Reviewed: 10/24/2014  Elsevier Interactive Patient Education © 2017 [employer]

## 2016-12-30 LAB — CULTURE, BLOOD (ROUTINE X 2)
CULTURE: NO GROWTH
SPECIAL REQUESTS: ADEQUATE

## 2016-12-31 LAB — CULTURE, BLOOD (ROUTINE X 2): CULTURE: NO GROWTH

## 2017-04-24 IMAGING — DX DG PELVIS 1-2V
1 series · 1 of 1 positions shown · non-contrast
Comparison: None.

CLINICAL DATA: Dementia.  Pain.

EXAM:
PELVIS - 1-2 VIEW

[pelvis ap]
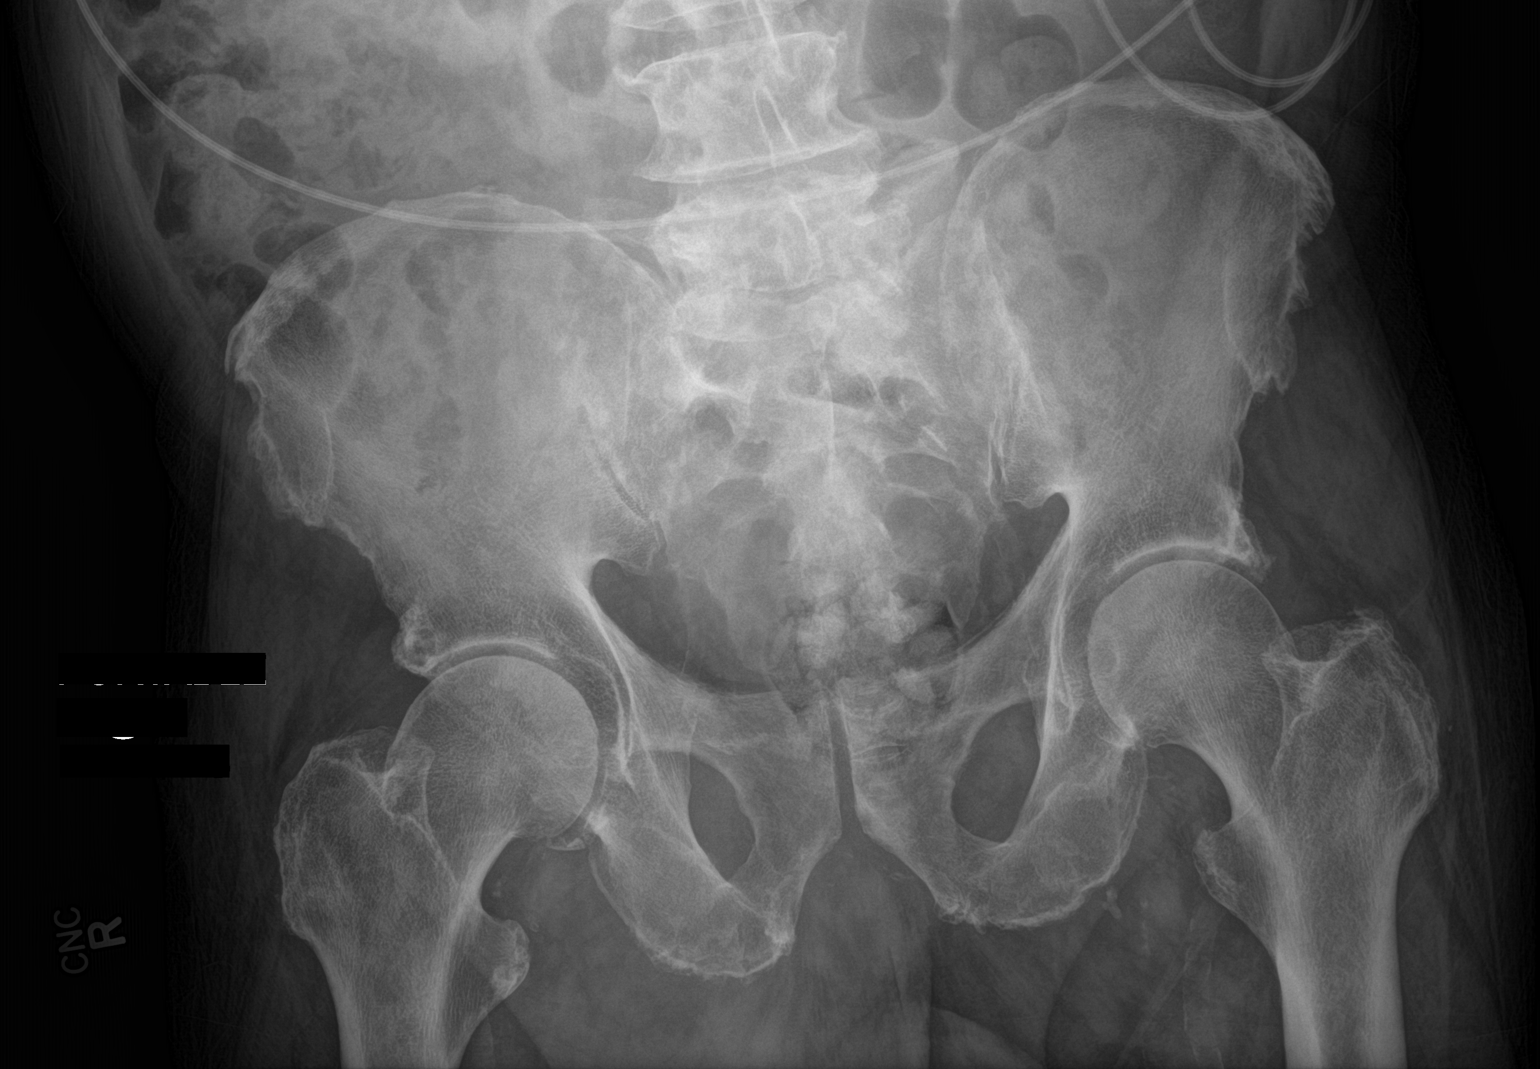

[1 of 1 positions shown; findings below may reference images not displayed]

FINDINGS: There are mild symmetric degenerative changes of the hips. There are
degenerative changes of the spine a mild degenerate change of the
sacroiliac joints. No evidence of acute fracture or dislocation.
IMPRESSION: No acute findings.

## 2017-08-18 IMAGING — US US ABDOMEN LIMITED
1 series · 14 of 25 positions shown · non-contrast
Comparison: None.

CLINICAL DATA: Elevated transaminase level.

EXAM:
US ABDOMEN LIMITED - RIGHT UPPER QUADRANT

[Series 1: us abdomen limited · 0.17mm/px · 14 of 43 slices shown]
[im 1/43]
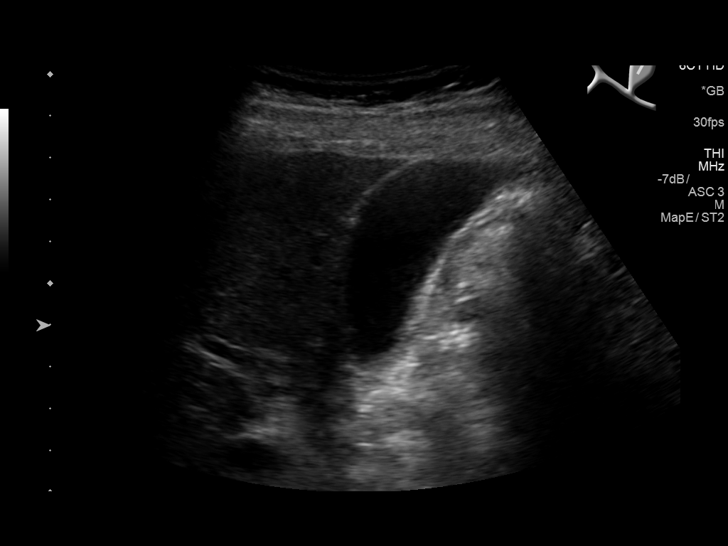
[im 4/43]
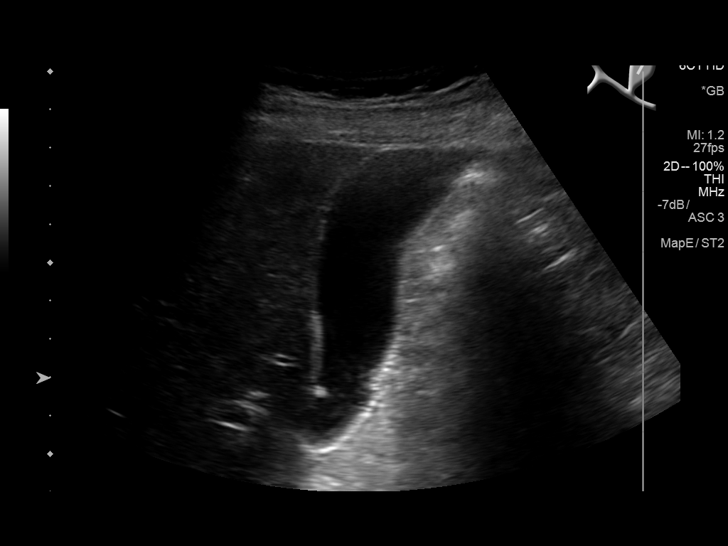
[im 8/43]
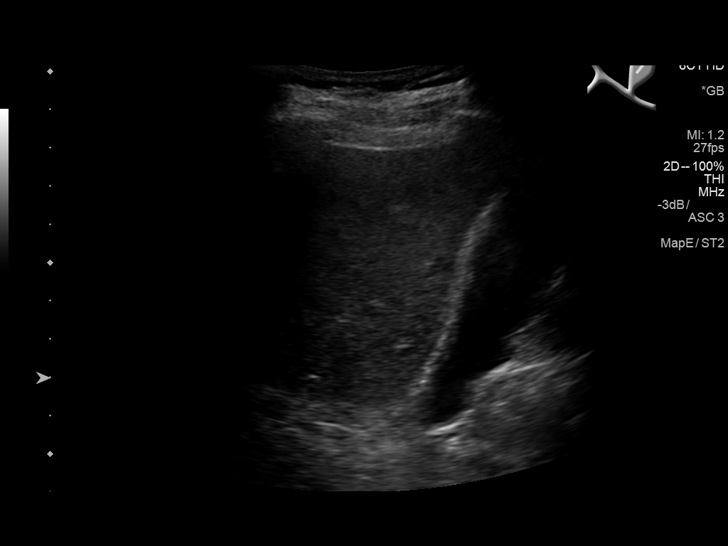
[im 11/43]
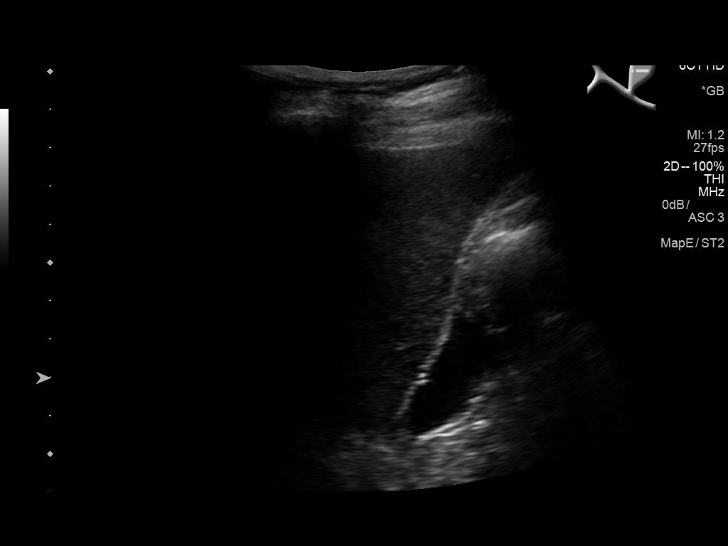
[im 15/43]
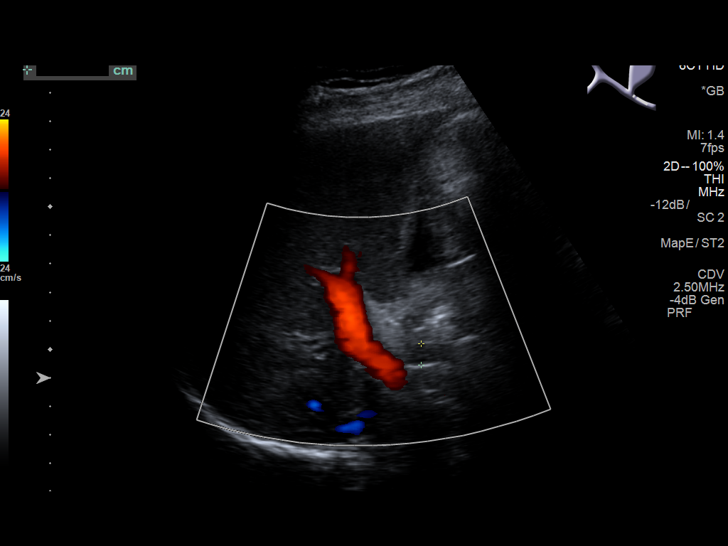
[im 16/43]
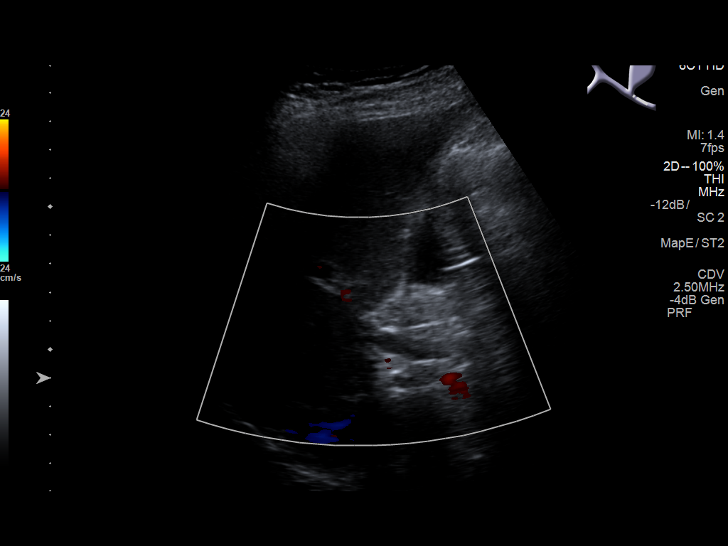
[im 20/43]
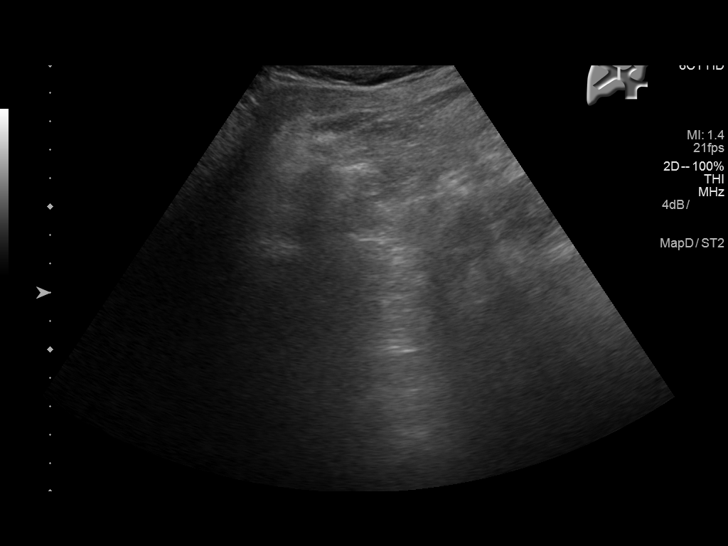
[im 23/43]
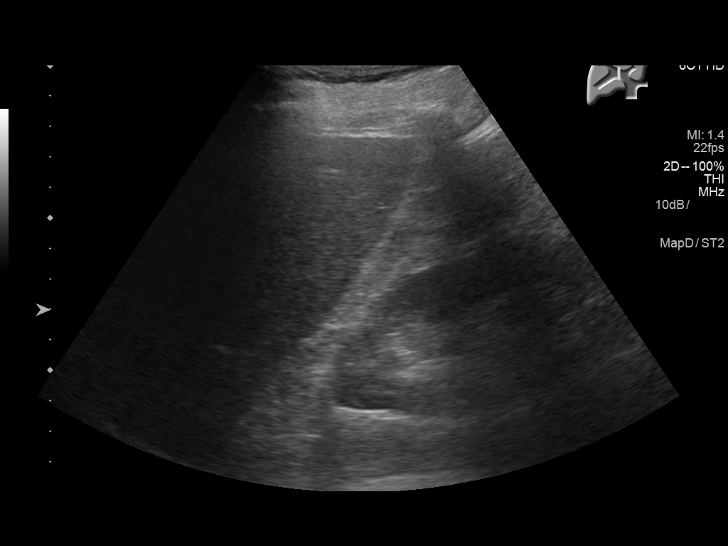
[im 27/43]
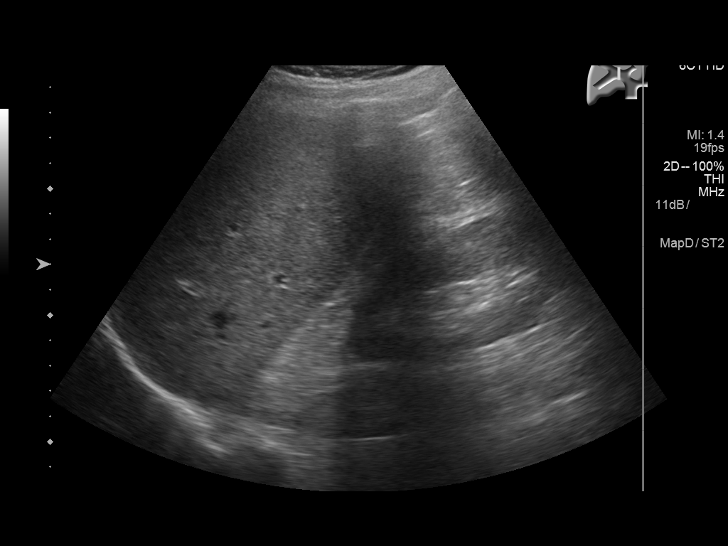
[im 29/43]
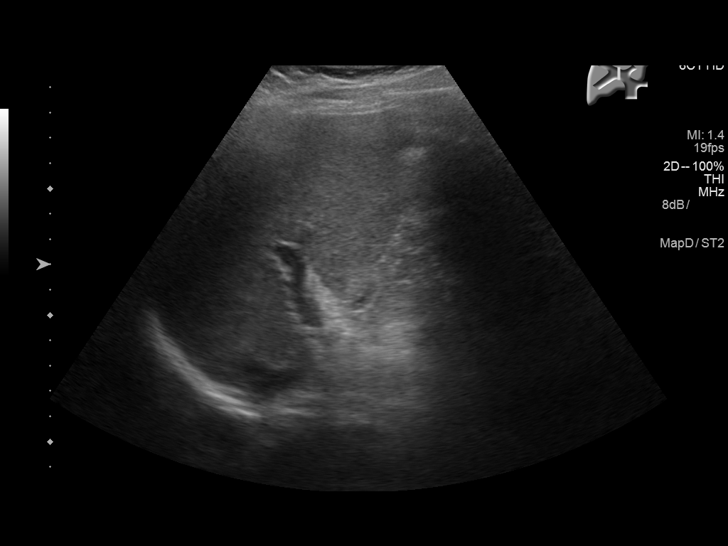
[im 32/43]
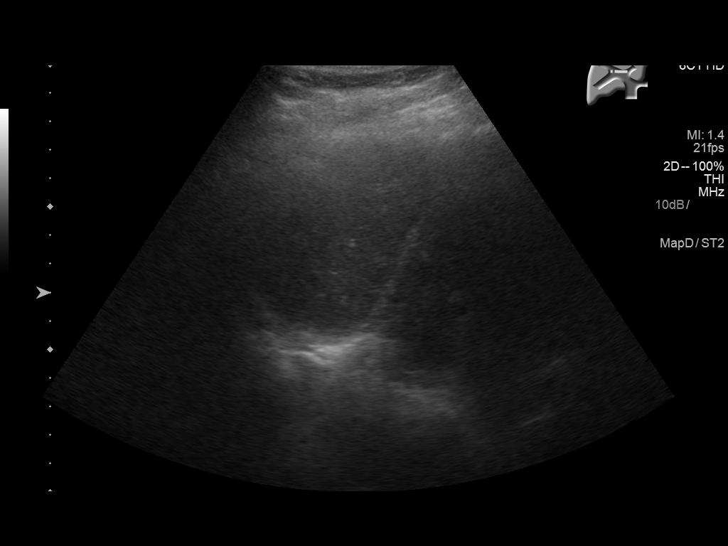
[im 36/43]
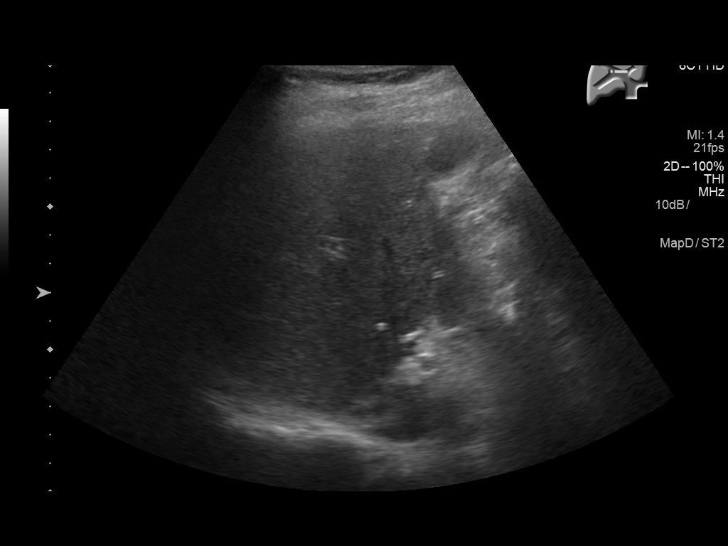
[im 39/43]
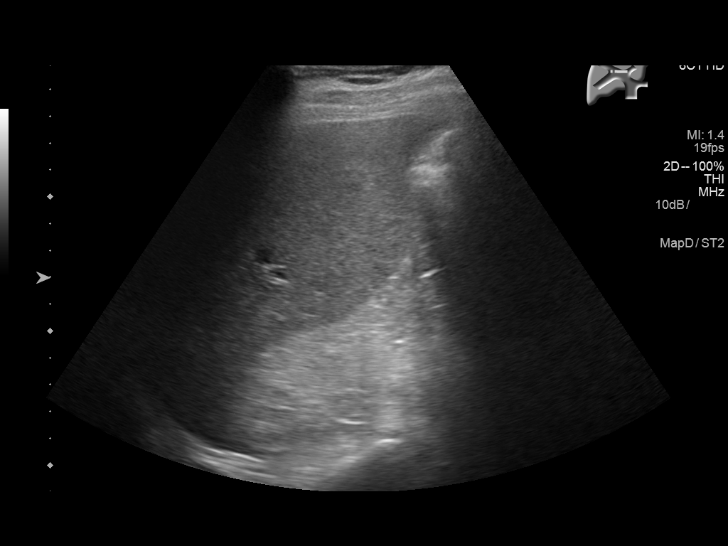
[im 43/43]
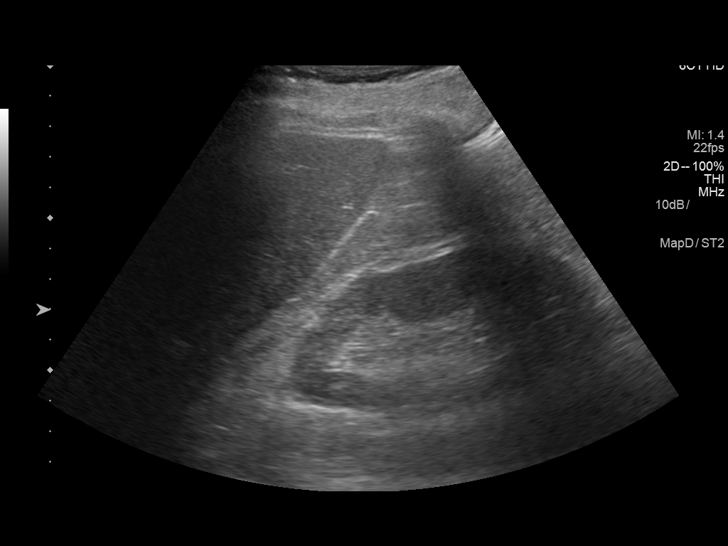

[14 of 25 positions shown; findings below may reference images not displayed]

FINDINGS: Gallbladder:

No gallstones or wall thickening visualized. No sonographic Murphy
sign noted by sonographer.

Common bile duct:

Diameter: 7.5 mm.

Liver:

Mild hepatic steatosis without focal mass. Suboptimal visualization
of the left lobe due to patient body habitus.
IMPRESSION: No acute hepatobiliary disease.

## 2017-09-01 ENCOUNTER — Encounter
Admission: RE | Admit: 2017-09-01 | Discharge: 2017-09-01 | Disposition: A | Discharge status: Home / Self Care | Payer: Medicare (Managed Care) | Source: Ambulatory Visit | Attending: Internal Medicine | Admitting: Internal Medicine

## 2017-12-22 IMAGING — CT CT HEAD W/O CM
3 of 4 series · 15 of 47 positions shown, 18 images · non-contrast
Comparison: None.

CLINICAL DATA: Found on bathroom floor. Question of head injury.
Initial encounter.

EXAM:
CT HEAD WITHOUT CONTRAST
TECHNIQUE: Contiguous axial images were obtained from the base of the skull
through the vertex without intravenous contrast.

[Series 4: ax head wo · axial · 0.43mm/px · z∈[-173,-29]mm · 9 of 33 slices shown, 12 images]
[im 2/33  brain]
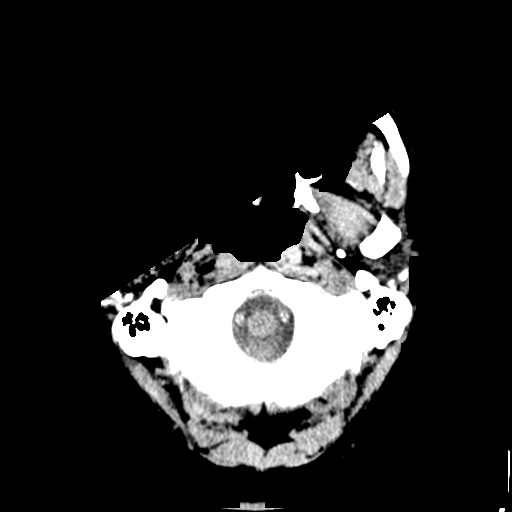
[im 2/33  bone]
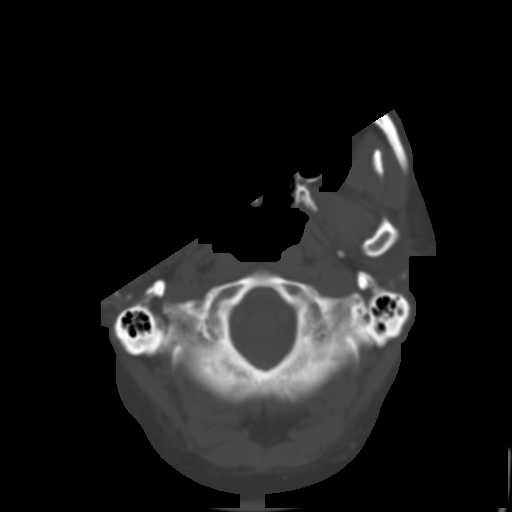
[im 6/33  brain]
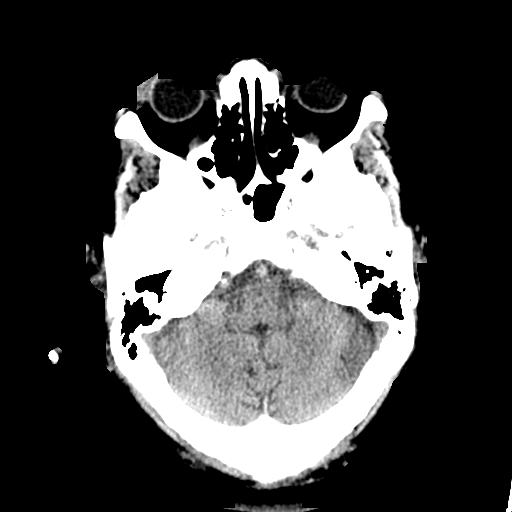
[im 9/33  brain]
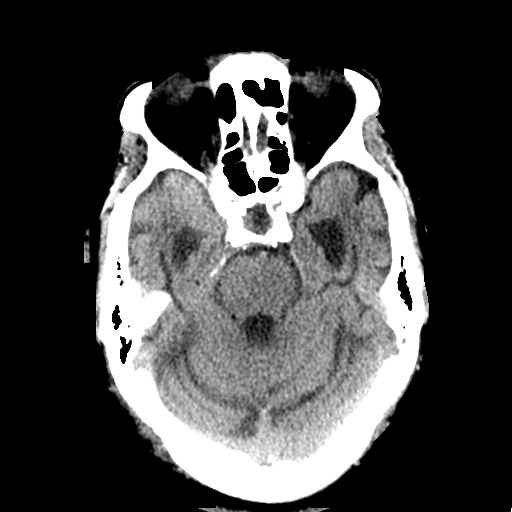
[im 13/33  brain]
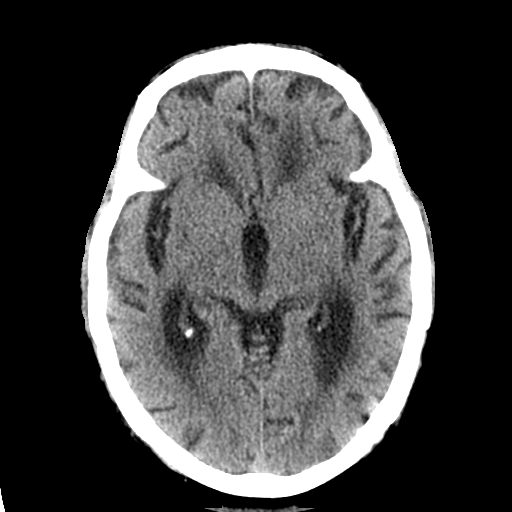
[im 17/33  brain]
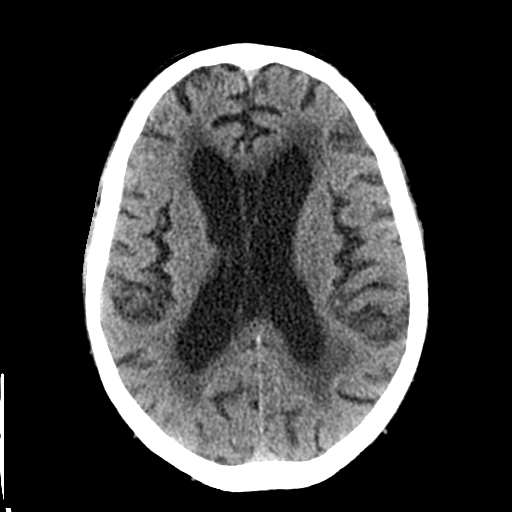
[im 17/33  bone]
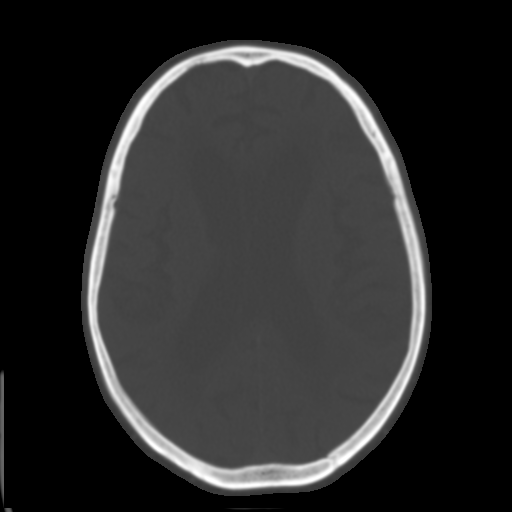
[im 20/33  brain]
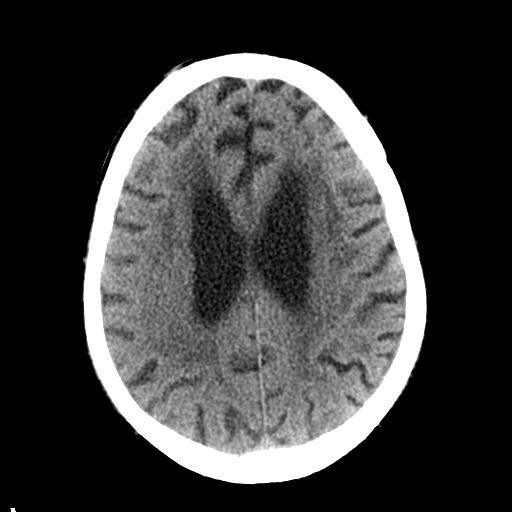
[im 24/33  brain]
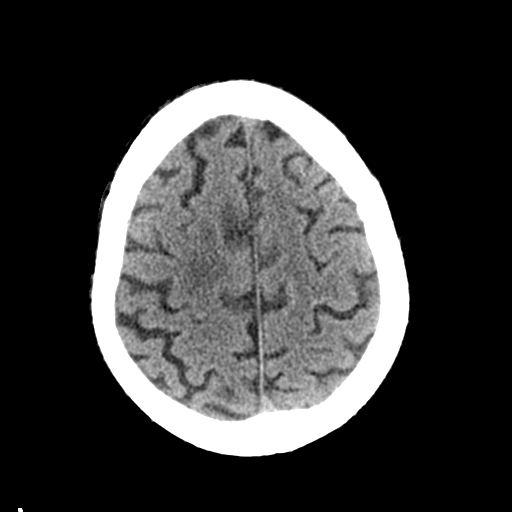
[im 27/33  brain]
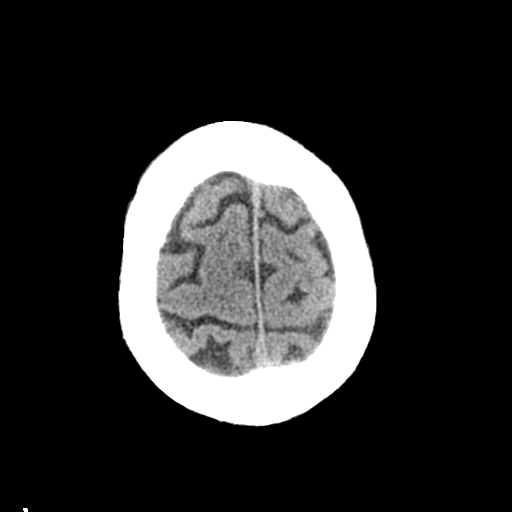
[im 31/33  brain]
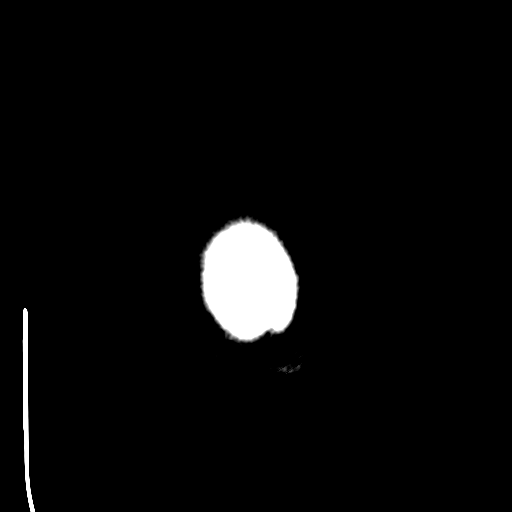
[im 31/33  bone]
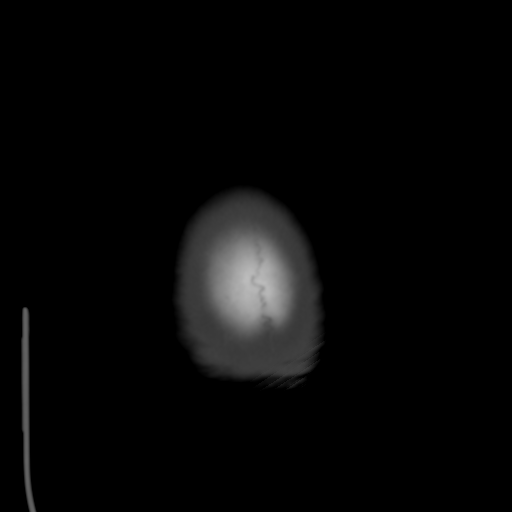

[Series 6: coronal soft tissue · coronal · 0.34mm/px · 3 of 66 slices shown]
[im 22/66  brain]
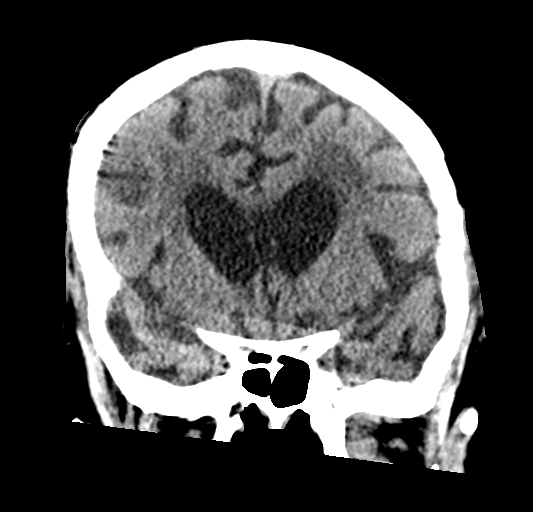
[im 29/66  brain]
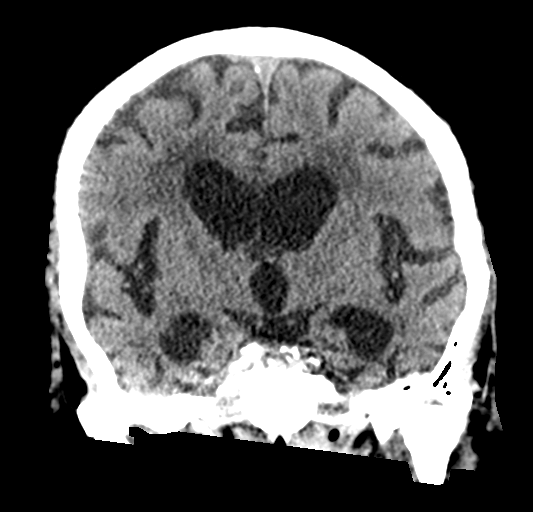
[im 37/66  brain]
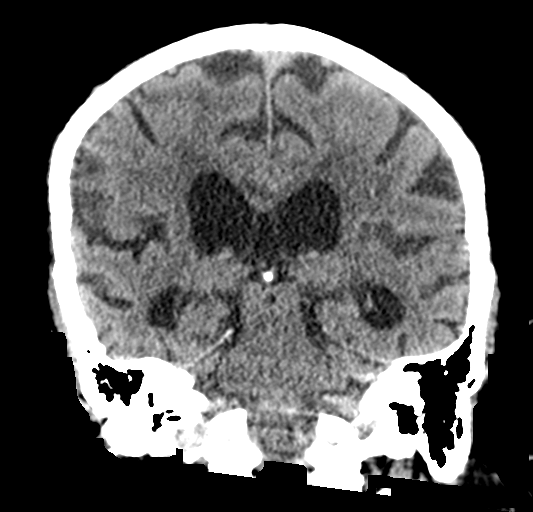

[Series 7: sagittal soft tissue · sagittal · 0.37mm/px · 3 of 55 slices shown]
[im 19/55  brain]
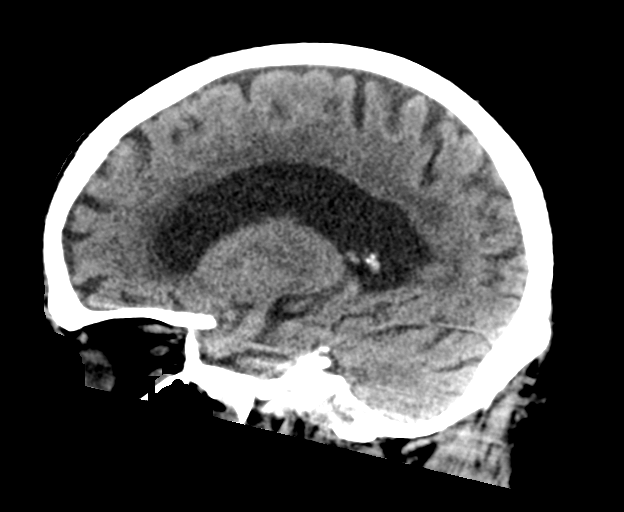
[im 28/55  brain]
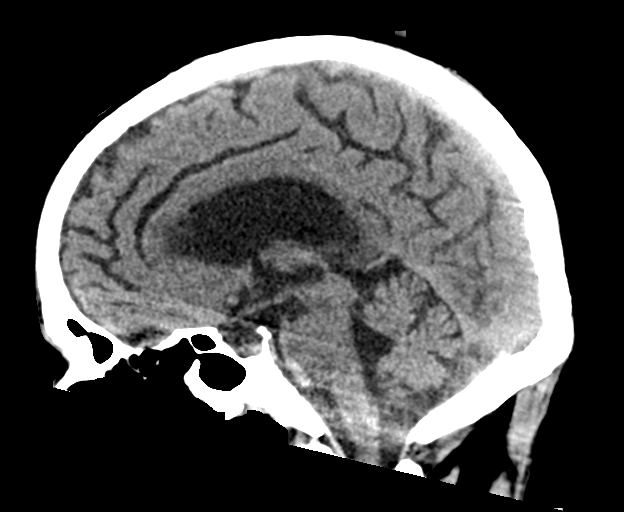
[im 36/55  brain]
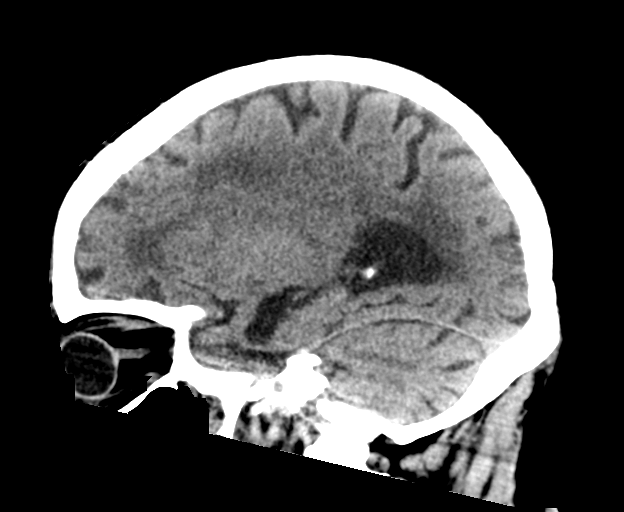

[15 of 47 positions shown; findings below may reference images not displayed]

FINDINGS: Brain: No evidence of acute infarction, hemorrhage, hydrocephalus,
extra-axial collection or mass lesion/mass effect.

Prominence of the ventricles and sulci reflects moderate cortical
volume loss. Diffuse periventricular and subcortical white matter
change likely reflects small vessel ischemic microangiopathy. Mild
cerebellar atrophy is noted.

The brainstem and fourth ventricle are within normal limits. The
basal ganglia are unremarkable in appearance. The cerebral
hemispheres demonstrate grossly normal gray-white differentiation.
No mass effect or midline shift is seen.

Vascular: No hyperdense vessel or unexpected calcification.

Skull: There is no evidence of fracture; visualized osseous
structures are unremarkable in appearance.

Sinuses/Orbits: The orbits are within normal limits. The paranasal
sinuses and mastoid air cells are well-aerated.

Other: No significant soft tissue abnormalities are seen.
IMPRESSION: 1. No evidence of traumatic intracranial injury or fracture.
2. Moderate cortical volume loss and diffuse small vessel ischemic
microangiopathy.

## 2018-02-01 ENCOUNTER — Other Ambulatory Visit
Admission: RE | Admit: 2018-02-01 | Discharge: 2018-02-01 | Disposition: A | Discharge status: Home / Self Care | Payer: Medicare (Managed Care) | Source: Skilled Nursing Facility | Attending: Family Medicine | Admitting: Family Medicine

## 2018-02-01 DIAGNOSIS — N390 Urinary tract infection, site not specified: Secondary | ICD-10-CM | POA: Insufficient documentation

## 2018-02-01 LAB — URINALYSIS, COMPLETE (UACMP) WITH MICROSCOPIC
BACTERIA UA: NONE SEEN
BILIRUBIN URINE: NEGATIVE
Glucose, UA: >=500 mg/dL — AB
HGB URINE DIPSTICK: NEGATIVE
KETONES UR: NEGATIVE mg/dL
NITRITE: NEGATIVE
Protein, ur: NEGATIVE mg/dL
Specific Gravity, Urine: 1.03 (ref 1.005–1.030)
pH: 5 (ref 5.0–8.0)

## 2018-04-06 ENCOUNTER — Other Ambulatory Visit: Payer: Self-pay

## 2018-04-06 ENCOUNTER — Emergency Department: Payer: Medicare (Managed Care)

## 2018-04-06 ENCOUNTER — Inpatient Hospital Stay
Admission: EM | Admit: 2018-04-06 | Discharge: 2018-04-10 | DRG: 179 | Disposition: A | Discharge status: Skilled Nursing Facility | Payer: Medicare (Managed Care) | Source: Skilled Nursing Facility | Attending: Internal Medicine | Admitting: Internal Medicine

## 2018-04-06 DIAGNOSIS — N281 Cyst of kidney, acquired: Secondary | ICD-10-CM | POA: Diagnosis present

## 2018-04-06 DIAGNOSIS — I10 Essential (primary) hypertension: Secondary | ICD-10-CM | POA: Diagnosis present

## 2018-04-06 DIAGNOSIS — E1122 Type 2 diabetes mellitus with diabetic chronic kidney disease: Secondary | ICD-10-CM | POA: Diagnosis present

## 2018-04-06 DIAGNOSIS — R1110 Vomiting, unspecified: Secondary | ICD-10-CM

## 2018-04-06 DIAGNOSIS — J690 Pneumonitis due to inhalation of food and vomit: Principal | ICD-10-CM | POA: Diagnosis present

## 2018-04-06 DIAGNOSIS — Z66 Do not resuscitate: Secondary | ICD-10-CM | POA: Diagnosis present

## 2018-04-06 DIAGNOSIS — J189 Pneumonia, unspecified organism: Secondary | ICD-10-CM

## 2018-04-06 DIAGNOSIS — F0390 Unspecified dementia without behavioral disturbance: Secondary | ICD-10-CM | POA: Diagnosis present

## 2018-04-06 LAB — COMPREHENSIVE METABOLIC PANEL
ALT: 10 U/L (ref 0–44)
AST: 18 U/L (ref 15–41)
Albumin: 3.6 g/dL (ref 3.5–5.0)
Alkaline Phosphatase: 55 U/L (ref 38–126)
Anion gap: 7 (ref 5–15)
BILIRUBIN TOTAL: 0.3 mg/dL (ref 0.3–1.2)
BUN: 17 mg/dL (ref 8–23)
CO2: 29 mmol/L (ref 22–32)
CREATININE: 1.16 mg/dL (ref 0.61–1.24)
Calcium: 8.6 mg/dL — ABNORMAL LOW (ref 8.9–10.3)
Chloride: 100 mmol/L (ref 98–111)
GFR calc Af Amer: >60 mL/min (ref >60)
GFR, EST NON AFRICAN AMERICAN: 52 mL/min — AB (ref >60)
Glucose, Bld: 288 mg/dL — ABNORMAL HIGH (ref 70–99)
Potassium: 3.9 mmol/L (ref 3.5–5.1)
Sodium: 136 mmol/L (ref 135–145)
TOTAL PROTEIN: 6.9 g/dL (ref 6.5–8.1)

## 2018-04-06 LAB — CBC
HCT: 41.1 % (ref 40.0–52.0)
Hemoglobin: 13.8 g/dL (ref 13.0–18.0)
MCH: 30.7 pg (ref 26.0–34.0)
MCHC: 33.7 g/dL (ref 32.0–36.0)
MCV: 91.2 fL (ref 80.0–100.0)
PLATELETS: 235 10*3/uL (ref 150–440)
RBC: 4.51 MIL/uL (ref 4.40–5.90)
RDW: 13.6 % (ref 11.5–14.5)
WBC: 8.9 10*3/uL (ref 3.8–10.6)

## 2018-04-06 LAB — LIPASE, BLOOD: Lipase: 17 U/L (ref 11–51)

## 2018-04-06 NOTE — ED Notes (Signed)
Pt given activity mat and he is working on things. Family at bedside. VSS.

## 2018-04-06 NOTE — ED Triage Notes (Signed)
Pt comes into the ED via EMS from [facility] with c/o from staff that the pt "vomited a large amount of liquid stool" pt has a hx of dementia, is alert on arrival but has been nonverbal with this nurse.

## 2018-04-06 NOTE — ED Notes (Signed)
Lab to bedside at this time. 

## 2018-04-06 NOTE — ED Notes (Signed)
Two unsuccessful PIV attempts per this RN. 

## 2018-04-06 NOTE — ED Provider Notes (Signed)
[facility] Emergency Department Provider Note  ____________________________________________   I have reviewed the triage vital signs and the nursing notes. Where available I have reviewed prior notes and, if possible and indicated, outside hospital notes.    HISTORY  Chief Complaint Emesis    HPI Edward Kim is a 84 y.o. male with severe dementia DNR/DNI, according to EMS he is at his neurologic and mental baseline, patient was sent here because he has had vomiting today which was described as "stool".  Patient cannot give a history no further history is available at this time.  Level 5 chart caveat; no further history available due to patient status.     Past Medical History:  Diagnosis Date  . Anemia   . CKD (chronic kidney disease), stage III (HCC)   . Dementia   . Diabetes mellitus without complication (HCC)   . HLD (hyperlipidemia)   . HTN (hypertension)     Patient Active Problem List   Diagnosis Date Noted  . Sepsis (HCC) 12/26/2016  . Influenza 11/01/2015  . Generalized weakness 11/01/2015  . CKD (chronic kidney disease), stage III (HCC) 11/01/2015  . Diabetes mellitus (HCC) 11/01/2015  . Elevated transaminase level 11/01/2015  . Elevated troponin 11/01/2015    History reviewed. No pertinent surgical history.  Prior to Admission medications   Medication Sig Start Date End Date Taking? Authorizing Provider  busPIRone (BUSPAR) 7.5 MG tablet Take 7.5 mg by mouth 2 (two) times daily.    [provider]  Loperamide HCl (IMODIUM A-D) 1 MG/7.5ML LIQD Take 15 mLs by mouth as needed.    [provider]  magnesium hydroxide (MILK OF MAGNESIA) 400 MG/5ML suspension Take 30 mLs by mouth daily as needed for mild constipation.    [provider]  ondansetron (ZOFRAN) 4 MG tablet Take 4 mg by mouth every 8 (eight) hours as needed for nausea or vomiting.    [provider]  pantoprazole (PROTONIX) 40 MG tablet Take  40 mg by mouth daily.    [provider]  quinapril (ACCUPRIL) 10 MG tablet Take 10 mg by mouth daily.    [provider]  traZODone (DESYREL) 100 MG tablet Take 100 mg by mouth at bedtime.    [provider]  Vitamin D, Ergocalciferol, (DRISDOL) 50000 units CAPS capsule Take 50,000 Units by mouth every 30 (thirty) days.    [provider]    Allergies Patient has no known allergies.  Family History  Family history unknown: Yes    Social History Social History   Tobacco Use  . Smoking status: Never Smoker  . Smokeless tobacco: Never Used  Substance Use Topics  . Alcohol use: No    Alcohol/week: 0.0 standard drinks  . Drug use: Not on file    Review of Systems Level 5 chart caveat; no further history available due to patient status.  Level 5 chart caveat; no further history available due to patient status.   ____________________________________________   PHYSICAL EXAM:  VITAL SIGNS: ED Triage Vitals [04/06/18 2157]  Enc Vitals Group     BP (!) 147/81     Pulse Rate (!) 103     Resp 18     Temp 98 F (36.7 C)     Temp Source Oral     SpO2 92 %     Weight 188 lb 7.9 oz (85.5 kg)     Height 6\' 1"  (1.854 m)     Head Circumference  Peak Flow      Pain Score      Pain Loc      Pain Edu?      Excl. in GC?     Constitutional: Alert no acute distress  eyes: Conjunctivae are normal Head: Atraumatic HEENT: No congestion/rhinnorhea. Mucous membranes are moist.  Oropharynx non-erythematous Neck:   Nontender with no meningismus, no masses, no stridor Cardiovascular: Normal rate, regular rhythm. Grossly normal heart sounds.  Good peripheral circulation. Respiratory: Normal respiratory effort.  No retractions. Lungs CTAB. Abdominal: Soft and distended perhaps but no tenderness noted. No distention. No guarding no rebound Neurologic:  Normal speech and language. No gross focal neurologic deficits are appreciated.  Skin:  Skin is  warm, dry and intact. No rash noted. Psychiatric: Mood and affect are normal. Speech and behavior are normal.  ____________________________________________   LABS (all labs ordered are listed, but only abnormal results are displayed)  Labs Reviewed  COMPREHENSIVE METABOLIC PANEL - Abnormal; Notable for the following components:      Result Value   Glucose, Bld 288 (*)    Calcium 8.6 (*)    GFR calc non Af Amer 52 (*)    All other components within normal limits  LIPASE, BLOOD  CBC  URINALYSIS, COMPLETE (UACMP) WITH MICROSCOPIC    Pertinent labs  results that were available during my care of the patient were reviewed by me and considered in my medical decision making (see chart for details). ____________________________________________  EKG  I personally interpreted any EKGs ordered by me or triage  ____________________________________________  RADIOLOGY  Pertinent labs & imaging results that were available during my care of the patient were reviewed by me and considered in my medical decision making (see chart for details). If possible, patient and/or family made aware of any abnormal findings.  Dg Chest Port 1 View  Result Date: 04/06/2018 CLINICAL DATA:  84 y/o  M; vomiting. EXAM: PORTABLE CHEST 1 VIEW COMPARISON:  12/25/2016 chest radiograph FINDINGS: Left lung base opacity. Stable cardiac silhouette given projection and technique. Calcific aortic atherosclerosis. Multilevel degenerative changes of the spine. No acute osseous abnormality is evident. IMPRESSION: Left lung base opacity may represent atelectasis, pneumonia, or aspiration given recent vomiting. Electronically Signed   By: Mitzi HansenLance  Furusawa-Stratton M.D.   On: 04/06/2018 22:20   ____________________________________________    PROCEDURES  Procedure(s) performed: None  Procedures  Critical Care performed: None  ____________________________________________   INITIAL IMPRESSION / ASSESSMENT AND PLAN / ED  COURSE  Pertinent labs & imaging results that were available during my care of the patient were reviewed by me and considered in my medical decision making (see chart for details).  He was reported feculent emesis although no evidence of active vomiting here, vitals and sugar were normal for EMS, patient is in no acute distress abdomen is possibly slightly distended, chest x-ray and blood work is reassuring.  Signed out at the end of my shift to Dr. Manson PasseyBrown, for further evaluation and CT scan.    ____________________________________________   FINAL CLINICAL IMPRESSION(S) / ED DIAGNOSES  Final diagnoses:  Vomiting      This chart was dictated using voice recognition software.  Despite best efforts to proofread,  errors can occur which can change meaning.      Jeanmarie PlantMcShane, Elizabeth Paulsen A, MD 04/06/18 2340

## 2018-04-06 NOTE — ED Notes (Signed)
Lab notified to collect blood needed blood specimens.

## 2018-04-07 ENCOUNTER — Emergency Department: Payer: Medicare (Managed Care)

## 2018-04-07 ENCOUNTER — Other Ambulatory Visit: Payer: Self-pay

## 2018-04-07 ENCOUNTER — Encounter: Payer: Self-pay | Admitting: Radiology

## 2018-04-07 DIAGNOSIS — J690 Pneumonitis due to inhalation of food and vomit: Secondary | ICD-10-CM | POA: Diagnosis present

## 2018-04-07 LAB — URINALYSIS, COMPLETE (UACMP) WITH MICROSCOPIC
Bacteria, UA: NONE SEEN
Bilirubin Urine: NEGATIVE
Glucose, UA: 50 mg/dL — AB
Hgb urine dipstick: NEGATIVE
Ketones, ur: NEGATIVE mg/dL
Leukocytes, UA: NEGATIVE
Nitrite: NEGATIVE
Protein, ur: NEGATIVE mg/dL
Specific Gravity, Urine: >1.046 — ABNORMAL HIGH (ref 1.005–1.030)
pH: 5 (ref 5.0–8.0)

## 2018-04-07 LAB — GLUCOSE, CAPILLARY
Glucose-Capillary: 225 mg/dL — ABNORMAL HIGH (ref 70–99)
Glucose-Capillary: 181 mg/dL — ABNORMAL HIGH (ref 70–99)
Glucose-Capillary: 236 mg/dL — ABNORMAL HIGH (ref 70–99)

## 2018-04-07 LAB — MRSA PCR SCREENING: MRSA by PCR: NEGATIVE

## 2018-04-07 LAB — TSH: TSH: 3.108 u[IU]/mL (ref 0.350–4.500)

## 2018-04-07 MED ORDER — NYSTATIN 100000 UNIT/ML MT SUSP
5.0000 mL | Freq: Four times a day (QID) | OROMUCOSAL | Status: DC
  Administered 2018-04-07 – 2018-04-10 (×13): 500000 [IU] via OROMUCOSAL
  Filled 2018-04-07 (×11): qty 5

## 2018-04-07 MED ORDER — PIPERACILLIN-TAZOBACTAM 3.375 G IVPB 30 MIN
3.3750 g | Freq: Once | INTRAVENOUS | Status: AC
  Administered 2018-04-07: 3.375 g via INTRAVENOUS
  Filled 2018-04-07: qty 50

## 2018-04-07 MED ORDER — BUSPIRONE HCL 15 MG PO TABS
7.5000 mg | ORAL_TABLET | Freq: Two times a day (BID) | ORAL | Status: DC
  Administered 2018-04-07 – 2018-04-09 (×4): 7.5 mg via ORAL
  Filled 2018-04-07 (×6): qty 1

## 2018-04-07 MED ORDER — GUAIFENESIN 100 MG/5ML PO SOLN
200.0000 mg | Freq: Four times a day (QID) | ORAL | Status: DC | PRN
  Filled 2018-04-07: qty 10

## 2018-04-07 MED ORDER — INSULIN ASPART 100 UNIT/ML ~~LOC~~ SOLN
0.0000 [IU] | Freq: Three times a day (TID) | SUBCUTANEOUS | Status: DC
  Administered 2018-04-07: 3 [IU] via SUBCUTANEOUS
  Administered 2018-04-07 – 2018-04-08 (×2): 2 [IU] via SUBCUTANEOUS
  Administered 2018-04-08 (×2): 3 [IU] via SUBCUTANEOUS
  Administered 2018-04-09: 2 [IU] via SUBCUTANEOUS
  Administered 2018-04-09: 3 [IU] via SUBCUTANEOUS
  Administered 2018-04-09: 1 [IU] via SUBCUTANEOUS
  Administered 2018-04-10: 5 [IU] via SUBCUTANEOUS
  Administered 2018-04-10: 2 [IU] via SUBCUTANEOUS
  Filled 2018-04-07 (×10): qty 1

## 2018-04-07 MED ORDER — TRAZODONE HCL 100 MG PO TABS
100.0000 mg | ORAL_TABLET | Freq: Every day | ORAL | Status: DC
  Administered 2018-04-07 – 2018-04-09 (×3): 100 mg via ORAL
  Filled 2018-04-07 (×3): qty 1

## 2018-04-07 MED ORDER — ONDANSETRON HCL 4 MG PO TABS: 4.0000 mg | ORAL_TABLET | Freq: Four times a day (QID) | ORAL | Status: DC | PRN

## 2018-04-07 MED ORDER — INSULIN ASPART 100 UNIT/ML ~~LOC~~ SOLN
0.0000 [IU] | Freq: Every day | SUBCUTANEOUS | Status: DC
  Administered 2018-04-07: 2 [IU] via SUBCUTANEOUS
  Filled 2018-04-07: qty 1

## 2018-04-07 MED ORDER — IOPAMIDOL (ISOVUE-300) INJECTION 61%
100.0000 mL | Freq: Once | INTRAVENOUS | Status: AC | PRN
  Administered 2018-04-07: 100 mL via INTRAVENOUS

## 2018-04-07 MED ORDER — ENOXAPARIN SODIUM 40 MG/0.4ML ~~LOC~~ SOLN
40.0000 mg | SUBCUTANEOUS | Status: DC
  Administered 2018-04-07 – 2018-04-08 (×2): 40 mg via SUBCUTANEOUS
  Filled 2018-04-07 (×2): qty 0.4

## 2018-04-07 MED ORDER — ACETAMINOPHEN 325 MG PO TABS: 650.0000 mg | ORAL_TABLET | Freq: Four times a day (QID) | ORAL | Status: DC | PRN

## 2018-04-07 MED ORDER — ACETAMINOPHEN 650 MG RE SUPP: 650.0000 mg | Freq: Four times a day (QID) | RECTAL | Status: DC | PRN

## 2018-04-07 MED ORDER — DOCUSATE SODIUM 100 MG PO CAPS
100.0000 mg | ORAL_CAPSULE | Freq: Two times a day (BID) | ORAL | Status: DC
  Administered 2018-04-07 – 2018-04-09 (×4): 100 mg via ORAL
  Filled 2018-04-07 (×4): qty 1

## 2018-04-07 MED ORDER — ADULT MULTIVITAMIN W/MINERALS CH
1.0000 | ORAL_TABLET | Freq: Every day | ORAL | Status: DC
  Administered 2018-04-07 – 2018-04-09 (×3): 1 via ORAL
  Filled 2018-04-07 (×3): qty 1

## 2018-04-07 MED ORDER — SODIUM CHLORIDE 0.9 % IV SOLN
INTRAVENOUS | Status: DC
  Administered 2018-04-07 – 2018-04-09 (×5): via INTRAVENOUS

## 2018-04-07 MED ORDER — NEPRO/CARBSTEADY PO LIQD
237.0000 mL | Freq: Two times a day (BID) | ORAL | Status: DC
  Administered 2018-04-07 – 2018-04-10 (×6): 237 mL via ORAL

## 2018-04-07 MED ORDER — FLUTICASONE PROPIONATE 50 MCG/ACT NA SUSP
1.0000 | Freq: Every day | NASAL | Status: DC
  Administered 2018-04-08 – 2018-04-10 (×3): 1 via NASAL
  Filled 2018-04-07: qty 16

## 2018-04-07 MED ORDER — SODIUM CHLORIDE 0.9 % IV SOLN
3.0000 g | Freq: Four times a day (QID) | INTRAVENOUS | Status: DC
  Administered 2018-04-07 – 2018-04-10 (×13): 3 g via INTRAVENOUS
  Filled 2018-04-07 (×17): qty 3

## 2018-04-07 MED ORDER — ONDANSETRON HCL 4 MG/2ML IJ SOLN: 4.0000 mg | Freq: Four times a day (QID) | INTRAMUSCULAR | Status: DC | PRN

## 2018-04-07 NOTE — Plan of Care (Signed)

## 2018-04-07 NOTE — Progress Notes (Signed)
Pharmacy Antibiotic Note  Edward PortsRainey Kim is a 84 y.o. male admitted on 04/06/2018 with aspiration pneumonia. Patient received one dose of Zosyn 8/27 @ 0330. No additional antibiotics ordered. Recommended starting Unasyn as patient does not appear to have any risk factors for pseudomonas. Per Dr. Amado CoeGouru, okay to switch from Zosyn to Unasyn. Pharmacy has been consulted for Unasyn dosing.  Plan: Start Unasyn 3 g IV q6h to start today at 1000  Height: 6\' 1"  (185.4 cm) Weight: 177 lb 4 oz (80.4 kg) IBW/kg (Calculated) : 79.9  Temp (24hrs), Avg:98.4 F (36.9 C), Min:98 F (36.7 C), Max:98.7 F (37.1 C)  Recent Labs  Lab 04/06/18 2305  WBC 8.9  CREATININE 1.16    Estimated Creatinine Clearance: 44 mL/min (by C-G formula based on SCr of 1.16 mg/dL).    No Known Allergies  Antimicrobials this admission: Zosyn 8/27 (one dose) Unasyn 8/27 >>   Dose adjustments this admission:   Microbiology results: 8/27 MRSA PCR: negative  Thank you for allowing pharmacy to be a part of this patient's care.  Pricilla RiffleAbby K Ellington, PharmD Pharmacy Resident  04/07/2018 8:50 AM

## 2018-04-07 NOTE — ED Notes (Signed)
Patient is resting comfortably. 

## 2018-04-07 NOTE — NC FL2 (Signed)
[place] MEDICAID FL2 LEVEL OF CARE SCREENING TOOL     IDENTIFICATION  Patient Name: Edward Kim Birthdate: 09-23-1921 Sex: male Admission Date (Current Location): 04/06/2018  [place] Number:  Chiropodist and Address:  [facility], [address], [place], [place] [address]      Provider Number: [id number]  Attending Physician Name and Address:  Ramonita Lab, MD  Relative Name and Phone Number:       Current Level of Care: Hospital Recommended Level of Care: Skilled Nursing Facility Prior Approval Number:    Date Approved/Denied:   PASRR Number:    Discharge Plan: SNF    Current Diagnoses: Patient Active Problem List   Diagnosis Date Noted  . Aspiration pneumonia (HCC) 04/07/2018  . Sepsis (HCC) 12/26/2016  . Influenza 11/01/2015  . Generalized weakness 11/01/2015  . CKD (chronic kidney disease), stage III (HCC) 11/01/2015  . Diabetes mellitus (HCC) 11/01/2015  . Elevated transaminase level 11/01/2015  . Elevated troponin 11/01/2015    Orientation RESPIRATION BLADDER Height & Weight        Normal Incontinent Weight: 177 lb 4 oz (80.4 kg) Height:  6\' 1"  (185.4 cm)  BEHAVIORAL SYMPTOMS/MOOD NEUROLOGICAL BOWEL NUTRITION STATUS  (none) (none) Incontinent Diet(dysphagia 1)  AMBULATORY STATUS COMMUNICATION OF NEEDS Skin   Total Care Verbally Normal                       Personal Care Assistance Level of Assistance  Total care       Total Care Assistance: Maximum assistance   Functional Limitations Info  (no deficiences noted)          SPECIAL CARE FACTORS FREQUENCY                       Contractures Contractures Info: Not present    Additional Factors Info  Code Status Code Status Info: dnr             Current Medications (04/07/2018):  This is the current hospital active medication list Current Facility-Administered Medications  Medication Dose Route Frequency Provider Last Rate  Last Dose  . 0.9 %  sodium chloride infusion   Intravenous Continuous Arnaldo Natal, MD 100 mL/hr at 04/07/18 0930    . acetaminophen (TYLENOL) tablet 650 mg  650 mg Oral Q6H PRN Arnaldo Natal, MD       Or  . acetaminophen (TYLENOL) suppository 650 mg  650 mg Rectal Q6H PRN Arnaldo Natal, MD      . Ampicillin-Sulbactam (UNASYN) 3 g in sodium chloride 0.9 % 100 mL IVPB  3 g Intravenous Q6H Ellington, Abby K, RPH 200 mL/hr at 04/07/18 0907 3 g at 04/07/18 0907  . busPIRone (BUSPAR) tablet 7.5 mg  7.5 mg Oral BID Arnaldo Natal, MD      . docusate sodium (COLACE) capsule 100 mg  100 mg Oral BID Arnaldo Natal, MD      . enoxaparin (LOVENOX) injection 40 mg  40 mg Subcutaneous Q24H Arnaldo Natal, MD      . feeding supplement (NEPRO CARB STEADY) liquid 237 mL  237 mL Oral BID BM Gouru, Aruna, MD      . fluticasone (FLONASE) 50 MCG/ACT nasal spray 1 spray  1 spray Each Nare Daily Arnaldo Natal, MD      . guaiFENesin (ROBITUSSIN) 100 MG/5ML solution 200 mg  200 mg Oral QID PRN Arnaldo Natal, MD      .  insulin aspart (novoLOG) injection 0-5 Units  0-5 Units Subcutaneous QHS Gouru, Aruna, MD      . insulin aspart (novoLOG) injection 0-9 Units  0-9 Units Subcutaneous TID WC Gouru, Aruna, MD      . multivitamin with minerals tablet 1 tablet  1 tablet Oral Daily Gouru, Aruna, MD      . nystatin (MYCOSTATIN) 100000 UNIT/ML suspension 500,000 Units  5 mL Mouth/Throat QID Gouru, Aruna, MD   500,000 Units at 04/07/18 1124  . ondansetron (ZOFRAN) tablet 4 mg  4 mg Oral Q6H PRN Arnaldo Nataliamond, Michael S, MD       Or  . ondansetron [facility](ZOFRAN) injection 4 mg  4 mg Intravenous Q6H PRN Arnaldo Nataliamond, Michael S, MD      . traZODone (DESYREL) tablet 100 mg  100 mg Oral QHS Arnaldo Nataliamond, Michael S, MD         Discharge Medications: Please see discharge summary for a list of discharge medications.  Relevant Imaging Results:  Relevant Lab Results:   Additional Information    York SpanielMonica Tamura Lasky,  LCSW

## 2018-04-07 NOTE — Evaluation (Signed)
Clinical/Bedside Swallow Evaluation Patient Details  Name: Edward Kim MRN: [id number] Date of Birth: 16-Sep-1921  Today's Date: 04/07/2018 Time: SLP Start Time (ACUTE ONLY): 1040 SLP Stop Time (ACUTE ONLY): 1130 SLP Time Calculation (min) (ACUTE ONLY): 50 min  Past Medical History:  Past Medical History:  Diagnosis Date  . Anemia   . CKD (chronic kidney disease), stage III (HCC)   . Dementia   . Diabetes mellitus without complication (HCC)   . HLD (hyperlipidemia)   . HTN (hypertension)    Past Surgical History: History reviewed. No pertinent surgical history. HPI:   Pt is a 84 y/o male with past medical history of Dementia, CKD,diabetes and hypertension presents to the emergency department via EMS from his nursing home due to vomiting.  The patient cannot contribute to his own history due to dementia.  A family member at bedside reported to the emergency department physician that the patient seems to be holding food in his mouth and spitting it out following mealtime. Then there was also report that he was vomiting fecal material.  Oxygen saturations and respiratory rate in the emergency department for normal on room air but the patient did have significant distention of his abdomen which prompted a CT of the abdomen that revealed an enormous Renal Cyst. Bases of lungs had the appearance consistent with probable pneumonia - could be related to recent vomiting and aspiration of such material. The patient was started on IV antibiotics in the emergency department staff called the hospitalist service for admission.  Pt is nonverbal but responds easily to mod. verbal/tactile stim(spoon/straw) at lips.  Pt appeared to present w/ a Thrush appearance(white coating on mid-tongue) - NSG informed.   Assessment / Plan / Recommendation Clinical Impression  Pt appeared to present w/ min+ oropharyngeal phase dysphagia w/ min decreased swallowing function impacted by his declined Cognitive status - this can  increased risk for aspiration/choking to occur as well as decreased oral intake overall. He does present w/ decreased Cognitive awareness w/ po tasks(baseline Dementia) and required mod-max tactile cues(spoon/straw to lips) and verbal cues for follow through w/ po tasks. Pt responded well and easily to the cues given. Pt was positioned upright and fed boluses of nectar liquids via Spoon/Straw and puree w/ no immediate, overt s/s of aspiration noted; no decline in respiratory effort b/t trials. During the oral phase, pt exhibited adequate bolus management w/ all trial consistencies; noted slightly min slower bolus management and A-P transfer time w/ puree trials but cleared adequately. Overall, pt exhibited timely bolus management/clearing w/ trials of Nectar liquids via straw and Purees. Pt required full feeding support; tactile/verbal cues to increase awareness of po tasks. OM exam appeared grossly [facility] for lingual/labial movements during bolus management and oral clearing - a THRUSH appearance was noted mid-posterior tongue. NSG informed. Recommend a Dysphagia level 1 (PUREE) w/ NECTAR liquids. Recommend aspiration precautions; Pills in Puree - Crushed as needed for safer swallowing. Feeding assistance at all meals d/t Cognitive status - need min tactile/verbal cues to lips w/ straw to accept trials.  SLP Visit Diagnosis: Dysphagia, oropharyngeal phase (R13.12)(d/t Cognitive status)    Aspiration Risk  Mild aspiration risk(but reduced following precautions)    Diet Recommendation  Dysphagia level 1 (PUREE) w/ NECTAR consistency liquids; aspiration precautions; feeding support w/ min tactile/verbal cues during feeding(spoon/straw to lips)  Medication Administration: Crushed with puree(for safer swallowing d/t Cognitive decline)    Other  Recommendations Recommended Consults: (Dietician f/u) Oral Care Recommendations: Oral care BID;Staff/trained caregiver to  provide oral care Other Recommendations: Order  thickener from pharmacy;Prohibited food (jello, ice cream, thin soups);Remove water pitcher;Have oral suction available   Follow up Recommendations (TBD)      Frequency and Duration min 3x week  2 weeks       Prognosis Prognosis for Safe Diet Advancement: Fair Barriers to Reach Goals: Cognitive deficits;Severity of deficits Barriers/Prognosis Comment: unknown baseline status       Swallow Study   General Date of Onset: 04/06/18 Type of Study: Bedside Swallow Evaluation Previous Swallow Assessment: unknown Diet Prior to this Study: NPO(unknown baseline but reportedly solids) Temperature Spikes Noted: No(wbc 8.9) Respiratory Status: Room air History of Recent Intubation: No Behavior/Cognition: Cooperative;Confused;Pleasant mood;Doesn't follow directions;Requires cueing(max cues but responded to tactile stim at lips) Oral Cavity Assessment: (appeared to have Thrush - white coating on mid+ tongue) Oral Care Completed by SLP: Recent completion by staff Oral Cavity - Dentition: Missing dentition Vision: (n/a) Self-Feeding Abilities: Total assist Patient Positioning: Upright in bed(supported well w/ pillows) Baseline Vocal Quality: (nonverbal but 2 phonations) Volitional Cough: Cognitively unable to elicit Volitional Swallow: Unable to elicit    Oral/Motor/Sensory Function Overall Oral Motor/Sensory Function: (appeared [facility] for lingual/labial movements w/ bolus trials)   Ice Chips Ice chips: Not tested   Thin Liquid Thin Liquid: Not tested    Nectar Thick Nectar Thick Liquid: Within functional limits Presentation: Spoon;Straw(3 via spoon; ~2 ozs via Straw) Other Comments: needed min tactile cues to lips w/ straw to accept trials   Honey Thick Honey Thick Liquid: Not tested   Puree Puree: Within functional limits Presentation: Spoon(fed; ~2-3 ozs) Other Comments: needed min tactile stim of spoon to lips to accept trials   Solid     Solid: Not tested       Jerilynn Som, MS, CCC-SLP Watson,Katherine 04/07/2018,11:30 AM

## 2018-04-07 NOTE — ED Notes (Signed)
Pt continues to cough. Family at bedside.

## 2018-04-07 NOTE — H&P (Signed)
Edward Kim is an 84 y.o. male.   Chief Complaint: Vomiting HPI: The patient with past medical history of dementia, diabetes and hypertension presents to the emergency department via EMS from his nursing home due to vomiting.  The patient cannot contribute to his own history due to dementia.  A family member at bedside reported to the emergency department physician that the patient seems to be holding food in his mouth and spitting it out following mealtime.  There was also report that he may be vomiting fecal material.  Oxygen saturations and respiratory rate in the emergency department for normal on room air but the patient did have significant distention of his abdomen which prompted a CT of the abdomen that revealed an enormous renal cyst.  Bases of lungs had the appearance consistent with probable pneumonia.  The patient was started on IV antibiotics in the emergency department staff called the hospitalist service for admission.  Past Medical History:  Diagnosis Date  . Anemia   . CKD (chronic kidney disease), stage III ([place])   . Dementia   . Diabetes mellitus without complication ([place])   . HLD (hyperlipidemia)   . HTN (hypertension)     History reviewed. No pertinent surgical history.  Family History  Family history unknown: Yes   Social History:  reports that he has never smoked. He has never used smokeless tobacco. He reports that he does not drink alcohol. His drug history is not on file.  Allergies: No Known Allergies  Medications Prior to Admission  Medication Sig Dispense Refill  . acetaminophen (TYLENOL) 325 MG tablet Take 650 mg by mouth every 4 (four) hours as needed for moderate pain or fever.    Marland Kitchen acetaminophen (TYLENOL) 500 MG tablet Take 500 mg by mouth every 8 (eight) hours as needed for mild pain or fever.    Marland Kitchen acetaminophen (TYLENOL) 650 MG suppository Place 650 mg rectally every 4 (four) hours as needed for moderate pain or fever.    . fluticasone (FLONASE) 50  MCG/ACT nasal spray Place 1 spray into both nostrils daily.    Marland Kitchen guaiFENesin (MUCINEX) 600 MG 12 hr tablet Take 600 mg by mouth 2 (two) times daily as needed (congestion).    Marland Kitchen guaiFENesin (ROBITUSSIN) 100 MG/5ML liquid Take 200 mg by mouth 4 (four) times daily as needed for congestion.    . traZODone (DESYREL) 100 MG tablet Take 100 mg by mouth at bedtime.    . busPIRone (BUSPAR) 7.5 MG tablet Take 7.5 mg by mouth 2 (two) times daily.      Results for orders placed or performed during the hospital encounter of 04/06/18 (from the past 48 hour(s))  Lipase, blood     Status: None   Collection Time: 04/06/18 11:05 PM  Result Value Ref Range   Lipase 17 11 - 51 U/L    Comment: Performed at [facility], [address] [place], [place] [address]  Comprehensive metabolic panel     Status: Abnormal   Collection Time: 04/06/18 11:05 PM  Result Value Ref Range   Sodium 136 135 - 145 mmol/L   Potassium 3.9 3.5 - 5.1 mmol/L   Chloride 100 98 - 111 mmol/L   CO2 29 22 - 32 mmol/L   Glucose, Bld 288 (H) 70 - 99 mg/dL   BUN 17 8 - 23 mg/dL   Creatinine, Ser 1.16 0.61 - 1.24 mg/dL   Calcium 8.6 (L) 8.9 - 10.3 mg/dL   Total Protein 6.9 6.5 - 8.1  g/dL   Albumin 3.6 3.5 - 5.0 g/dL   AST 18 15 - 41 U/L   ALT 10 0 - 44 U/L   Alkaline Phosphatase 55 38 - 126 U/L   Total Bilirubin 0.3 0.3 - 1.2 mg/dL   GFR calc non Af Amer 52 (L) >60 mL/min   GFR calc Af Amer >60 >60 mL/min    Comment: (NOTE) The eGFR has been calculated using the CKD EPI equation. This calculation has not been validated in all clinical situations. eGFR's persistently <60 mL/min signify possible Chronic Kidney Disease.    Anion gap 7 5 - 15    Comment: Performed at [facility], [address] [place], [place] [address]  CBC     Status: None   Collection Time: 04/06/18 11:05 PM  Result Value Ref Range   WBC 8.9 3.8 - 10.6 K/uL   RBC 4.51 4.40 - 5.90 MIL/uL   Hemoglobin 13.8 13.0 - 18.0 g/dL   HCT 41.1  40.0 - 52.0 %   MCV 91.2 80.0 - 100.0 fL   MCH 30.7 26.0 - 34.0 pg   MCHC 33.7 32.0 - 36.0 g/dL   RDW 13.6 11.5 - 14.5 %   Platelets 235 150 - 440 K/uL    Comment: Performed at [facility], [address] [place], [place] [address]  TSH     Status: None   Collection Time: 04/06/18 11:05 PM  Result Value Ref Range   TSH 3.108 0.350 - 4.500 uIU/mL    Comment: Performed by a 3rd Generation assay with a functional sensitivity of <=0.01 uIU/mL. Performed at [facility], [address], [place], [place] [address]    Ct Abdomen Pelvis W Contrast  Result Date: 04/07/2018 CLINICAL DATA:  84 y/o M; nausea and vomiting. History of dementia. EXAM: CT ABDOMEN AND PELVIS WITH CONTRAST TECHNIQUE: Multidetector CT imaging of the abdomen and pelvis was performed using the standard protocol following bolus administration of intravenous contrast. CONTRAST:  157m ISOVUE-300 IOPAMIDOL (ISOVUE-300) INJECTION 61% COMPARISON:  None. FINDINGS: Lower chest: Bilateral lower lobe peribronchial thickening, scattered airway occlusion, and clustered nodules in bronchovascular distribution. Aortic valve replacement. Severe coronary artery calcification. Small hiatal hernia. Hepatobiliary: No focal liver abnormality is seen. No gallstones, gallbladder wall thickening, or biliary dilatation. Pancreas: Unremarkable. No pancreatic ductal dilatation or surrounding inflammatory changes. Spleen: Normal in size without focal abnormality. Adrenals/Urinary Tract: Normal adrenal glands. Multiple adrenal cysts measuring up to 14.5 cm arising from the upper pole of left kidney. No hydronephrosis. Normal bladder. Stomach/Bowel: Stomach is within normal limits. Appendix appears normal. No evidence of bowel wall thickening, distention, or inflammatory changes. Vascular/Lymphatic: Severe aortic atherosclerosis. No enlarged abdominal or pelvic lymph nodes. Reproductive: Moderate prostate enlargement extending into the floor  of the bladder. Other: No abdominal wall hernia or abnormality. No abdominopelvic ascites. Musculoskeletal: Mild-to-moderate osteoarthrosis of the visible spine and hips. No acute osseous abnormality identified. IMPRESSION: 1. Peribronchial thickening and clustered nodules in the lung bases may represent bronchopneumonia or aspiration given recent vomiting. 2. Small hiatal hernia. 3. Severe coronary artery and aortic calcific atherosclerosis. 4. Multiple renal cysts measuring up to 14.5 cm arising from the upper pole of left kidney. Electronically Signed   By: LKristine GarbeM.D.   On: 04/07/2018 02:51   Dg Chest Port 1 View  Result Date: 04/06/2018 CLINICAL DATA:  84y/o  M; vomiting. EXAM: PORTABLE CHEST 1 VIEW COMPARISON:  12/25/2016 chest radiograph FINDINGS: Left lung base opacity. Stable cardiac silhouette  given projection and technique. Calcific aortic atherosclerosis. Multilevel degenerative changes of the spine. No acute osseous abnormality is evident. IMPRESSION: Left lung base opacity may represent atelectasis, pneumonia, or aspiration given recent vomiting. Electronically Signed   By: Kristine Garbe M.D.   On: 04/06/2018 22:20    Review of Systems  Unable to perform ROS: Dementia    Blood pressure 134/68, pulse (!) 113, temperature 98.7 F (37.1 C), temperature source Oral, resp. rate 18, height 6' 1"  (1.854 m), weight 80.4 kg, SpO2 97 %. Physical Exam  Vitals reviewed. Constitutional: He is oriented to person, place, and time. He appears well-developed and well-nourished. No distress.  HENT:  Head: Normocephalic and atraumatic.  Mouth/Throat: Oropharynx is clear and moist.  Eyes: Pupils are equal, round, and reactive to light. Conjunctivae and EOM are normal. No scleral icterus.  Neck: Normal range of motion. Neck supple. No JVD present. No tracheal deviation present. No thyromegaly present.  Cardiovascular: Normal rate, regular rhythm and normal heart sounds.  Exam reveals no gallop and no friction rub.  No murmur heard. Respiratory: Breath sounds normal. No respiratory distress.  GI: Soft. Bowel sounds are normal. He exhibits no distension. There is no tenderness.  Genitourinary:  Genitourinary Comments: Deferred  Musculoskeletal: Normal range of motion. He exhibits no edema.  Lymphadenopathy:    He has no cervical adenopathy.  Neurological: He is alert and oriented to person, place, and time. No cranial nerve deficit.  Skin: Skin is warm and dry. No rash noted. No erythema.  Psychiatric:  Difficult to assess mental status as the patient is nonverbal with examiner     Assessment/Plan This is a 84 year old male admitted for aspiration pneumonia. 1.  Pneumonia: Zosyn for aspiration.  The patient will need a swallow study prior to discharge to assess diet recommendations. 2.  Dementia: Contributing to aspiration.  The patient is nonverbal with me and appears not to understand what I am saying.  Continue BuSpar and trazodone 3.  Hypertension: Acceptable for age. 4.  Diabetes mellitus type 2: Untreated due to age 55.  DVT prophylaxis: Lovenox 6.  GI prophylaxis: None The patient is a full code at this time.  Time spent on admission orders and patient care approximately 45 minutes  Harrie Foreman, MD 04/07/2018, 6:52 AM

## 2018-04-07 NOTE — Clinical Social Work Note (Signed)
Clinical Social Work Assessment  Patient Details  Name: Edward Kim MRN: [id number] Date of Birth: [birth year]  Date of referral:  04/07/18               Reason for consult:  Discharge Planning                Permission sought to share information with:    Permission granted to share information::     Name::        Agency::     Relationship::     Contact Information:     Housing/Transportation Living arrangements for the past 2 months:  Skilled Nursing Facility Source of Information:  Case Manager([facility] case Production designer, theatre/television/filmmanager) Patient Interpreter Needed:  None Criminal Activity/Legal Involvement Pertinent to Current Situation/Hospitalization:  No - Comment as needed Significant Relationships:  Adult Children Lives with:  Facility Resident Do you feel safe going back to the place where you live?  Yes Need for family participation in patient care:  Yes (Comment)  Care giving concerns:  [facility]   Social Worker assessment / plan:  CSW [facility] and [facility] and that [facility] will continue to contract with them for him to remain there. CSW spoke with Ladona Rid[facility]aylor [facility] and she stated that they could take patient back at discharge.   Employment status:    Insurance informationAdvertising account executive:  Managed Medicare PT Recommendations:    Information / Referral to community resources:     Patient/Family's Response to care:  Patient's niece: Oretha CapriceOmetta Corbett: [phone], was at patient's bedside and agrees with [facility].   Patient/Family's Understanding of and Emotional Response to Diagnosis, Current Treatment, and Prognosis:  Patient's niece is involved in patient's treatment plan and is very caring towards patient.   Emotional Assessment Appearance:    Attitude/Demeanor/Rapport:    Affect (typically observed):    Orientation:    Alcohol / Substance use:  Not Applicable Psych involvement (Current  and /or in the community):  No (Comment)  Discharge Needs  Concerns to be addressed:  Care Coordination Readmission within the last 30 days:  No Current discharge risk:  None Barriers to Discharge:  No Barriers Identified   York SpanielMonica Nelissa Bolduc, LCSW 04/07/2018, 11:31 AM

## 2018-04-07 NOTE — Progress Notes (Signed)
Initial Nutrition Assessment  DOCUMENTATION CODES:   Not applicable  INTERVENTION:   Nepro Shake po BID, each supplement provides 425 kcal and 19 grams protein  Magic cup TID with meals, each supplement provides 290 kcal and 9 grams of protein  MVI daily   NUTRITION DIAGNOSIS:   Increased nutrient needs related to acute illness(PNA) as evidenced by increased estimated needs.  GOAL:   Patient will meet greater than or equal to 90% of their needs  MONITOR:   PO intake, Supplement acceptance, Labs, Skin, Weight trends, I & O's  REASON FOR ASSESSMENT:   Low Braden    ASSESSMENT:   84 y/o male with h/o dementia, DM, CKD III admitted with vomiting and aspiration PNA    Visited pt's room today. Unable to obtain nutrition related history as pt with dementia. No family at bedside. Pt appears well nourished. Per H & P, family reports pt has recently been holding food in his mouth and spitting it out. Pt seen by SLP today and initiated on a dysphagia 1/nectar thick diet. Pt ate some applesauce and drank 1/2 of a juice for SLP. There is no recent weight history in chart to confirm if any weight loss. RD will order supplements and MVI to help pt meet his estimated needs.     Medications reviewed and include: colace, lovenox, insulin, NaCl @100ml /hr, unasyn  Labs reviewed:   NUTRITION - FOCUSED PHYSICAL EXAM:    Most Recent Value  Orbital Region  No depletion  Upper Arm Region  No depletion  Thoracic and Lumbar Region  No depletion  Buccal Region  No depletion  Temple Region  No depletion  Clavicle Bone Region  No depletion  Clavicle and Acromion Bone Region  No depletion  Scapular Bone Region  No depletion  Dorsal Hand  No depletion  Patellar Region  Mild depletion  Anterior Thigh Region  Mild depletion  Posterior Calf Region  Moderate depletion  Edema (RD Assessment)  None  Hair  Reviewed  Eyes  Reviewed  Mouth  Reviewed  Skin  Reviewed  Nails  Reviewed     Diet  Order:   Diet Order            DIET - DYS 1 Room service appropriate? Yes with Assist; Fluid consistency: Nectar Thick  Diet effective now             EDUCATION NEEDS:   Not appropriate for education at this time  Skin:  Skin Assessment: Reviewed RN Assessment  Last BM:  pta  Height:   Ht Readings from Last 1 Encounters:  04/06/18 6\' 1"  (1.854 m)    Weight:   Wt Readings from Last 1 Encounters:  04/07/18 80.4 kg    Ideal Body Weight:  83.6 kg  BMI:  Body mass index is 23.39 kg/m.  Estimated Nutritional Needs:   Kcal:  1800-2100kcal/day   Protein:  80-96g/day   Fluid:  >2L/day   Betsey Holidayasey Titianna Loomis MS, RD, LDN Pager #- [phone] Office#- [phone] After Hours Pager: [phone]

## 2018-04-07 NOTE — ED Notes (Signed)
Pt admitted to 206 report called toMelissa RN

## 2018-04-07 NOTE — Progress Notes (Signed)
Family Meeting Note  Advance Directive:yes  Today a meeting took place with the Patient's niece, healthcare power of attorney at patient bedside.  Patient is unable to participate due ZO:XWRUEAto:Lacked capacity Demented   The following clinical team members were present during this meeting:MD  The following were discussed:Patient's diagnosis: Aspiration pneumonia, aspiration, speech therapy evaluation and the report and the treatment plan of care discussed in detail.  Patient's other comorbidities also discussed in detail as documented below    Anemia   . CKD (chronic kidney disease), stage III (HCC)   . Dementia   . Diabetes mellitus without complication (HCC)   . HLD (hyperlipidemia)   . HTN (hypertension)      patient's progosis: Unable to determine and Goals for treatment: DNR  [name] patient's niece is a healthcare power of attorney  Additional follow-up to be provided: Hospitalist  Time spent during discussion:3317min  Ramonita LabAruna Chiante Peden, MD

## 2018-04-07 NOTE — ED Notes (Signed)
Pt back from CT

## 2018-04-07 NOTE — Progress Notes (Addendum)
[facility] - [place] at [facility]   PATIENT NAME: Edward Kim    MR#:  [id number]  DATE OF BIRTH:  [birth year]  SUBJECTIVE:  CHIEF COMPLAINT:  Pt iis demented   REVIEW OF SYSTEMS:  ROS unobtainable   DRUG ALLERGIES:  No Known Allergies  VITALS:  Blood pressure 134/68, pulse (!) 113, temperature 98.7 F (37.1 C), temperature source Oral, resp. rate 18, height 6\' 1"  (1.854 m), weight 80.4 kg, SpO2 97 %.  PHYSICAL EXAMINATION:  GENERAL:  84 y.o.-year-old patient lying in the bed with no acute distress.  EYES: Pupils equal, round, reactive to light and accommodation. No scleral icterus. Extraocular muscles intact.  HEENT: Head atraumatic, normocephalic. Oropharynx and nasopharynx clear.  NECK:  Supple, no jugular venous distention. No thyroid enlargement, no tenderness.  LUNGS: Mod  breath sounds bilaterally, no wheezing, rales,rhonchi or crepitation. No use of accessory muscles of respiration.  CARDIOVASCULAR: S1, S2 normal. No murmurs, rubs, or gallops.  ABDOMEN: Soft, nontender, nondistended. Bowel sounds present. No organomegaly or mass.  EXTREMITIES: No pedal edema, cyanosis, or clubbing.  NEUROLOGIC: altered Gait not checked.  PSYCHIATRIC: The patient is alert and oriented x 3.  SKIN: No obvious rash, lesion, or ulcer.    LABORATORY PANEL:   CBC Recent Labs  Lab 04/06/18 2305  WBC 8.9  HGB 13.8  HCT 41.1  PLT 235   ------------------------------------------------------------------------------------------------------------------  Chemistries  Recent Labs  Lab 04/06/18 2305  NA 136  K 3.9  CL 100  CO2 29  GLUCOSE 288*  BUN 17  CREATININE 1.16  CALCIUM 8.6*  AST 18  ALT 10  ALKPHOS 55  BILITOT 0.3   ------------------------------------------------------------------------------------------------------------------  Cardiac Enzymes No results for input(s): TROPONINI in the last 168  hours. ------------------------------------------------------------------------------------------------------------------  RADIOLOGY:  Ct Abdomen Pelvis W Contrast  Result Date: 04/07/2018 CLINICAL DATA:  84 y/o M; nausea and vomiting. History of dementia. EXAM: CT ABDOMEN AND PELVIS WITH CONTRAST TECHNIQUE: Multidetector CT imaging of the abdomen and pelvis was performed using the standard protocol following bolus administration of intravenous contrast. CONTRAST:  100mL ISOVUE-300 IOPAMIDOL (ISOVUE-300) INJECTION 61% COMPARISON:  None. FINDINGS: Lower chest: Bilateral lower lobe peribronchial thickening, scattered airway occlusion, and clustered nodules in bronchovascular distribution. Aortic valve replacement. Severe coronary artery calcification. Small hiatal hernia. Hepatobiliary: No focal liver abnormality is seen. No gallstones, gallbladder wall thickening, or biliary dilatation. Pancreas: Unremarkable. No pancreatic ductal dilatation or surrounding inflammatory changes. Spleen: Normal in size without focal abnormality. Adrenals/Urinary Tract: Normal adrenal glands. Multiple adrenal cysts measuring up to 14.5 cm arising from the upper pole of left kidney. No hydronephrosis. Normal bladder. Stomach/Bowel: Stomach is within normal limits. Appendix appears normal. No evidence of bowel wall thickening, distention, or inflammatory changes. Vascular/Lymphatic: Severe aortic atherosclerosis. No enlarged abdominal or pelvic lymph nodes. Reproductive: Moderate prostate enlargement extending into the floor of the bladder. Other: No abdominal wall hernia or abnormality. No abdominopelvic ascites. Musculoskeletal: Mild-to-moderate osteoarthrosis of the visible spine and hips. No acute osseous abnormality identified. IMPRESSION: 1. Peribronchial thickening and clustered nodules in the lung bases may represent bronchopneumonia or aspiration given recent vomiting. 2. Small hiatal hernia. 3. Severe coronary artery and  aortic calcific atherosclerosis. 4. Multiple renal cysts measuring up to 14.5 cm arising from the upper pole of left kidney. Electronically Signed   By: Mitzi HansenLance  Furusawa-Stratton M.D.   On: 04/07/2018 02:51   Dg Chest Port 1 View  Result Date: 04/06/2018 CLINICAL DATA:  84 y/o  M; vomiting. EXAM: PORTABLE CHEST  1 VIEW COMPARISON:  12/25/2016 chest radiograph FINDINGS: Left lung base opacity. Stable cardiac silhouette given projection and technique. Calcific aortic atherosclerosis. Multilevel degenerative changes of the spine. No acute osseous abnormality is evident. IMPRESSION: Left lung base opacity may represent atelectasis, pneumonia, or aspiration given recent vomiting. Electronically Signed   By: Mitzi Hansen M.D.   On: 04/06/2018 22:20    EKG:   Orders placed or performed during the hospital encounter of 12/25/16  . ED EKG  . ED EKG  . EKG 12-Lead  . EKG 12-Lead    ASSESSMENT AND PLAN:    This is a 84 year old male admitted for aspiration pneumonia.  1.  Pneumonia: Zosyn  Given once. MRSA PCR neg. Pt is started on unasyn .  The patient had a  swallow study- dyspagia 3 , puree diet started  Urinalysis looks normal 2.  Dementia: Contributing to aspiration.  The patient is nonverbal with me and appears not to understand what I am saying.  Continue BuSpar and trazodone 3.  Hypertension: Acceptable for age. 4.  Diabetes mellitus type 2: ISS 5.  DVT prophylaxis: Lovenox 6.  GI prophylaxis: None    All the records are reviewed and case discussed with Care Management/Social Workerr. Management plans discussed with the patient, family and they are in agreement.  CODE STATUS: DNR   TOTAL TIME TAKING CARE OF THIS PATIENT: 36  minutes.   POSSIBLE D/C IN 1-2  DAYS, DEPENDING ON CLINICAL CONDITION.  Note: This dictation was prepared with Dragon dictation along with smaller phrase technology. Any transcriptional errors that result from this process are  unintentional.   Ramonita Lab M.D on 04/07/2018 at 12:30 PM  Between 7am to 6pm - Pager - [phone] After 6pm go to [email or web address] - password [email or web address]  [facility]  Office  [phone]  CC: Primary care physician; Patient, No Pcp Per

## 2018-04-07 NOTE — ED Notes (Signed)
This tech and Sheri, RN in to change pt into clean brief. Pt readjusted in bed and resting at this time. Family remains at bedside.

## 2018-04-07 NOTE — ED Notes (Signed)
Pt awaiting admission bed

## 2018-04-08 DIAGNOSIS — E1122 Type 2 diabetes mellitus with diabetic chronic kidney disease: Secondary | ICD-10-CM | POA: Diagnosis present

## 2018-04-08 DIAGNOSIS — I10 Essential (primary) hypertension: Secondary | ICD-10-CM | POA: Diagnosis present

## 2018-04-08 DIAGNOSIS — Z66 Do not resuscitate: Secondary | ICD-10-CM | POA: Diagnosis present

## 2018-04-08 DIAGNOSIS — R1110 Vomiting, unspecified: Secondary | ICD-10-CM | POA: Diagnosis present

## 2018-04-08 DIAGNOSIS — N281 Cyst of kidney, acquired: Secondary | ICD-10-CM | POA: Diagnosis present

## 2018-04-08 DIAGNOSIS — J690 Pneumonitis due to inhalation of food and vomit: Secondary | ICD-10-CM | POA: Diagnosis present

## 2018-04-08 DIAGNOSIS — F0390 Unspecified dementia without behavioral disturbance: Secondary | ICD-10-CM | POA: Diagnosis present

## 2018-04-08 LAB — HEMOGLOBIN A1C
Hgb A1c MFr Bld: 8.2 % — ABNORMAL HIGH (ref 4.8–5.6)
Mean Plasma Glucose: 188.64 mg/dL

## 2018-04-08 LAB — GLUCOSE, CAPILLARY
Glucose-Capillary: 157 mg/dL — ABNORMAL HIGH (ref 70–99)
Glucose-Capillary: 207 mg/dL — ABNORMAL HIGH (ref 70–99)
Glucose-Capillary: 209 mg/dL — ABNORMAL HIGH (ref 70–99)
Glucose-Capillary: 127 mg/dL — ABNORMAL HIGH (ref 70–99)

## 2018-04-08 NOTE — Progress Notes (Signed)
[facility] - [place] at [facility]   PATIENT NAME: Edward Kim    MR#:  [id number]  DATE OF BIRTH:  29-Dec-1921  SUBJECTIVE:  CHIEF COMPLAINT:  Pt iis demented at his baseline but he is more lethargic according to the patient's healthcare power of attorney is niece who is at bedside  REVIEW OF SYSTEMS:  ROS unobtainable   DRUG ALLERGIES:  No Known Allergies  VITALS:  Blood pressure 136/69, pulse 85, temperature (!) 97.5 F (36.4 C), temperature source Oral, resp. rate 16, height 6\' 1"  (1.854 m), weight 80.4 kg, SpO2 100 %.  PHYSICAL EXAMINATION:  GENERAL:  84 y.o.-year-old patient lying in the bed with no acute distress.  EYES: Pupils equal, round, reactive to light and accommodation. No scleral icterus. Extraocular muscles intact.  HEENT: Head atraumatic, normocephalic. Oropharynx and nasopharynx clear.  NECK:  Supple, no jugular venous distention. No thyroid enlargement, no tenderness.  LUNGS: Mod  breath sounds bilaterally, no wheezing, rales,rhonchi or crepitation. No use of accessory muscles of respiration.  CARDIOVASCULAR: S1, S2 normal. No murmurs, rubs, or gallops.  ABDOMEN: Soft, nontender, nondistended. Bowel sounds present. No organomegaly or mass.  EXTREMITIES: No pedal edema, cyanosis, or clubbing.  NEUROLOGIC: altered Gait not checked.  PSYCHIATRIC: The patient is alert and oriented x 3.  SKIN: No obvious rash, lesion, or ulcer.    LABORATORY PANEL:   CBC Recent Labs  Lab 04/06/18 2305  WBC 8.9  HGB 13.8  HCT 41.1  PLT 235   ------------------------------------------------------------------------------------------------------------------  Chemistries  Recent Labs  Lab 04/06/18 2305  NA 136  K 3.9  CL 100  CO2 29  GLUCOSE 288*  BUN 17  CREATININE 1.16  CALCIUM 8.6*  AST 18  ALT 10  ALKPHOS 55  BILITOT 0.3    ------------------------------------------------------------------------------------------------------------------  Cardiac Enzymes No results for input(s): TROPONINI in the last 168 hours. ------------------------------------------------------------------------------------------------------------------  RADIOLOGY:  Ct Abdomen Pelvis W Contrast  Result Date: 04/07/2018 CLINICAL DATA:  84 y/o M; nausea and vomiting. History of dementia. EXAM: CT ABDOMEN AND PELVIS WITH CONTRAST TECHNIQUE: Multidetector CT imaging of the abdomen and pelvis was performed using the standard protocol following bolus administration of intravenous contrast. CONTRAST:  ISOVUE-300 IOPAMIDOL (ISOVUE-300) INJECTION 61% COMPARISON:  None. FINDINGS: Lower chest: Bilateral lower lobe peribronchial thickening, scattered airway occlusion, and clustered nodules in bronchovascular distribution. Aortic valve replacement. Severe coronary artery calcification. Small hiatal hernia. Hepatobiliary: No focal liver abnormality is seen. No gallstones, gallbladder wall thickening, or biliary dilatation. Pancreas: Unremarkable. No pancreatic ductal dilatation or surrounding inflammatory changes. Spleen: Normal in size without focal abnormality. Adrenals/Urinary Tract: Normal adrenal glands. Multiple adrenal cysts measuring up to 14.5 cm arising from the upper pole of left kidney. No hydronephrosis. Normal bladder. Stomach/Bowel: Stomach is within normal limits. Appendix appears normal. No evidence of bowel wall thickening, distention, or inflammatory changes. Vascular/Lymphatic: Severe aortic atherosclerosis. No enlarged abdominal or pelvic lymph nodes. Reproductive: Moderate prostate enlargement extending into the floor of the bladder. Other: No abdominal wall hernia or abnormality. No abdominopelvic ascites. Musculoskeletal: Mild-to-moderate osteoarthrosis of the visible spine and hips. No acute osseous abnormality identified. IMPRESSION:  1. Peribronchial thickening and clustered nodules in the lung bases may represent bronchopneumonia or aspiration given recent vomiting. 2. Small hiatal hernia. 3. Severe coronary artery and aortic calcific atherosclerosis. 4. Multiple renal cysts measuring up to 14.5 cm arising from the upper pole of left kidney. Electronically Signed   By: Mitzi Hansen M.D.   On: 04/07/2018 02:51  Dg Chest Port 1 View  Result Date: 04/06/2018 CLINICAL DATA:  84 y/o  M; vomiting. EXAM: PORTABLE CHEST 1 VIEW COMPARISON:  12/25/2016 chest radiograph FINDINGS: Left lung base opacity. Stable cardiac silhouette given projection and technique. Calcific aortic atherosclerosis. Multilevel degenerative changes of the spine. No acute osseous abnormality is evident. IMPRESSION: Left lung base opacity may represent atelectasis, pneumonia, or aspiration given recent vomiting. Electronically Signed   By: Mitzi HansenLance  Furusawa-Stratton M.D.   On: 04/06/2018 22:20    EKG:   Orders placed or performed during the hospital encounter of 12/25/16  . ED EKG  . ED EKG  . EKG 12-Lead  . EKG 12-Lead    ASSESSMENT AND PLAN:    This is a 84 year old male admitted for aspiration pneumonia.  1.  Pneumonia: Zosyn  Given once. MRSA PCR neg.  Continue Unasyn .  T he patient had a  swallow study- dyspagia 3 , puree diet started  Urinalysis looks normal  2.  Dementia: Contributing to aspiration.  The patient is nonverbal with me and appears not to understand what I am saying.  Continue BuSpar and trazodone  3.  Hypertension: Acceptable for age. 4.  Diabetes mellitus type 2: ISS 5.  DVT prophylaxis: Lovenox 6.  GI prophylaxis: None  7.  Generalized weakness continue outpatient pace program  All the records are reviewed and case discussed with Care Management/Social Workerr. Management plans discussed with the patient's healthcare power of attorney at bedside she is agreeable CODE STATUS: DNR   TOTAL TIME TAKING CARE OF  THIS PATIENT: 36  minutes.   POSSIBLE D/C IN 1-  DAYS, DEPENDING ON CLINICAL CONDITION.  Note: This dictation was prepared with Dragon dictation along with smaller phrase technology. Any transcriptional errors that result from this process are unintentional.   Ramonita LabAruna Genesis Paget M.D on 04/08/2018 at 1:42 PM  Between 7am to 6pm - Pager - [phone] After 6pm go to [email or web address] - password [email or web address]  [facility]  Office  [phone]  CC: Primary care physician; Patient, No Pcp Per

## 2018-04-08 NOTE — Progress Notes (Signed)
Pharmacy Antibiotic Note  Edward Kim is a 84 y.o. male admitted on 04/06/2018 with aspiration pneumonia. Patient received one dose of Zosyn 8/27 @ 0330. No additional antibiotics ordered. Recommended starting Unasyn as patient does not appear to have any risk factors for pseudomonas. Per Dr. Amado CoeGouru, okay to switch from Zosyn to Unasyn. Pharmacy has been consulted for Unasyn dosing.  Plan: Continue Unasyn 3 g IV q6h SCr ordered for tomorrow. Will assess renal function for any necessary dosage adjustments  Height: 6\' 1"  (185.4 cm) Weight: 177 lb 4 oz (80.4 kg) IBW/kg (Calculated) : 79.9  Temp (24hrs), Avg:99.6 F (37.6 C), Min:98.2 F (36.8 C), Max:101.1 F (38.4 C)  Recent Labs  Lab 04/06/18 2305  WBC 8.9  CREATININE 1.16    Estimated Creatinine Clearance: 44 mL/min (by C-G formula based on SCr of 1.16 mg/dL).    No Known Allergies  Antimicrobials this admission: Zosyn 8/27 (one dose) Unasyn 8/27 >>   Dose adjustments this admission:   Microbiology results: 8/27 MRSA PCR: negative  Thank you for allowing pharmacy to be a part of this patient's care.  Pricilla RiffleAbby K Hamilton Marinello, PharmD Pharmacy Resident  04/08/2018 7:52 AM

## 2018-04-09 ENCOUNTER — Inpatient Hospital Stay: Payer: Medicare (Managed Care)

## 2018-04-09 LAB — CREATININE, SERUM
Creatinine, Ser: 0.64 mg/dL (ref 0.61–1.24)
GFR calc non Af Amer: >60 mL/min (ref >60)

## 2018-04-09 LAB — GLUCOSE, CAPILLARY
Glucose-Capillary: 136 mg/dL — ABNORMAL HIGH (ref 70–99)
Glucose-Capillary: 225 mg/dL — ABNORMAL HIGH (ref 70–99)
Glucose-Capillary: 176 mg/dL — ABNORMAL HIGH (ref 70–99)
Glucose-Capillary: 200 mg/dL — ABNORMAL HIGH (ref 70–99)

## 2018-04-09 MED ORDER — MODAFINIL 100 MG PO TABS
100.0000 mg | ORAL_TABLET | Freq: Every day | ORAL | Status: DC
  Administered 2018-04-09 – 2018-04-10 (×2): 100 mg via ORAL
  Filled 2018-04-09: qty 1

## 2018-04-09 NOTE — Progress Notes (Signed)
[facility] - [place] at [facility]   PATIENT NAME: Edward Kim    MR#:  [id number]  DATE OF BIRTH:  13-Apr-1922  SUBJECTIVE:  CHIEF COMPLAINT: Dementia unable to provide any review of systems his niece states that he has been very drowsy. REVIEW OF SYSTEMS:  ROS unobtainable   DRUG ALLERGIES:  No Known Allergies  VITALS:  Blood pressure (!) 151/71, pulse 81, temperature 97.8 F (36.6 C), temperature source Oral, resp. rate 16, height 6\' 1"  (1.854 m), weight 80.3 kg, SpO2 99 %.  PHYSICAL EXAMINATION:  GENERAL:  84 y.o.-year-old patient lying in the bed with no acute distress.  EYES: Pupils equal, round, reactive to light and accommodation. No scleral icterus. Extraocular muscles intact.  HEENT: Head atraumatic, normocephalic. Oropharynx and nasopharynx clear.  NECK:  Supple, no jugular venous distention. No thyroid enlargement, no tenderness.  LUNGS: Mod  breath sounds bilaterally, no wheezing, rales,rhonchi or crepitation. No use of accessory muscles of respiration.  CARDIOVASCULAR: S1, S2 normal. No murmurs, rubs, or gallops.  ABDOMEN: Soft, nontender, nondistended. Bowel sounds present. No organomegaly or mass.  EXTREMITIES: No pedal edema, cyanosis, or clubbing.  NEUROLOGIC: altered Gait not checked.  PSYCHIATRIC: The patient is alert and oriented x 3.  SKIN: No obvious rash, lesion, or ulcer.    LABORATORY PANEL:   CBC Recent Labs  Lab 04/06/18 2305  WBC 8.9  HGB 13.8  HCT 41.1  PLT 235   ------------------------------------------------------------------------------------------------------------------  Chemistries  Recent Labs  Lab 04/06/18 2305 04/09/18 0358  NA 136  --   K 3.9  --   CL 100  --   CO2 29  --   GLUCOSE 288*  --   BUN 17  --   CREATININE 1.16 0.64  CALCIUM 8.6*  --   AST 18  --   ALT 10  --   ALKPHOS 55  --   BILITOT 0.3  --     ------------------------------------------------------------------------------------------------------------------  Cardiac Enzymes No results for input(s): TROPONINI in the last 168 hours. ------------------------------------------------------------------------------------------------------------------  RADIOLOGY:  No results found.  EKG:   Orders placed or performed during the hospital encounter of 12/25/16  . ED EKG  . ED EKG  . EKG 12-Lead  . EKG 12-Lead    ASSESSMENT AND PLAN:    This is a 84 year old male admitted for aspiration pneumonia.  1.  Aspiration Pneumonia:  Continue Unasyn .  T he patient had a  swallow study-dysphagia diet per speech 2.  Dementia: Contributing to aspiration.  The patient is nonverbal with me and appears not to understand what I am saying.  Continue BuSpar and trazodone, due to somnolence I will add Provigil explained to patient's niece that his dementia may be getting worse. 3.  Hypertension: Acceptable for age. 4.  Diabetes mellitus type 2: ISS 5.  DVT prophylaxis: Lovenox 6.  GI prophylaxis: None 7.  Generalized weakness continue outpatient pace program  All the records are reviewed and case discussed with Care Management/Social Workerr. Management plans discussed with the patient's healthcare power of attorney at bedside she is agreeable CODE STATUS: DNR   TOTAL TIME TAKING CARE OF THIS PATIENT: 35  minutes.   POSSIBLE D/C IN 1-  DAYS, DEPENDING ON CLINICAL CONDITION.  Note: This dictation was prepared with Dragon dictation along with smaller phrase technology. Any transcriptional errors that result from this process are unintentional.   Auburn Bilberry M.D on 04/09/2018 at 1:13 PM  Between 7am to 6pm - Pager - [phone] After  6pm go to [email or web address] - password [email or web address]  [facility] [place] [facility]  Office  [phone]  CC: Primary care physician; Patient, No Pcp Per

## 2018-04-09 NOTE — Progress Notes (Signed)
Pharmacy Antibiotic Note  Francee GentileRainey Enochis a89 y.o.maleadmittedon 8/26/2019with aspirationpneumonia.Pharmacy has been consulted forUnasyndosing.  Plan: Continue Unasyn 3 g IV q6h. Per conversation with Dr. Allena KatzPatel, will possibly transition patient to oral antibiotics tomorrow depending on clinical condition  Height: 6\' 1"  (185.4 cm) Weight: 177 lb 0.5 oz (80.3 kg) IBW/kg (Calculated) : 79.9  Temp (24hrs), Avg:98.2 F (36.8 C), Min:97.8 F (36.6 C), Max:98.9 F (37.2 C)  Recent Labs  Lab 04/06/18 2305 04/09/18 0358  WBC 8.9  --   CREATININE 1.16 0.64    Estimated Creatinine Clearance: 63.8 mL/min (by C-G formula based on SCr of 0.64 mg/dL).    No Known Allergies  Antimicrobials this admission: Zosyn 8/27 (one dose) Unasyn 8/27 >>  Microbiology results: 8/27 MRSA PCR: negative  Thank you for allowing pharmacy to be a part of this patient's care.  Pricilla RiffleAbby K Carle Dargan, PharmD Pharmacy Resident  04/09/2018 1:42 PM

## 2018-04-09 NOTE — Plan of Care (Signed)
  Problem: Education: Goal: Knowledge of General Education information will improve Description: Including pain rating scale, medication(s)/side effects and non-pharmacologic comfort measures Outcome: Progressing   Problem: Health Behavior/Discharge Planning: Goal: Ability to manage health-related needs will improve Outcome: Progressing   Problem: Clinical Measurements: Goal: Ability to maintain clinical measurements within normal limits will improve Outcome: Progressing Goal: Will remain free from infection Outcome: Progressing Goal: Diagnostic test results will improve Outcome: Progressing Goal: Respiratory complications will improve Outcome: Progressing Goal: Cardiovascular complication will be avoided Outcome: Progressing   Problem: Activity: Goal: Risk for activity intolerance will decrease Outcome: Progressing   Problem: Nutrition: Goal: Adequate nutrition will be maintained Outcome: Progressing   Problem: Coping: Goal: Level of anxiety will decrease Outcome: Progressing   Problem: Elimination: Goal: Will not experience complications related to bowel motility Outcome: Progressing Goal: Will not experience complications related to urinary retention Outcome: Progressing   Problem: Pain Managment: Goal: General experience of comfort will improve Outcome: Progressing   Problem: Safety: Goal: Ability to remain free from injury will improve Outcome: Progressing   Problem: Activity: Goal: Ability to tolerate increased activity will improve Outcome: Progressing   Problem: Skin Integrity: Goal: Risk for impaired skin integrity will decrease Outcome: Progressing   Problem: Clinical Measurements: Goal: Ability to maintain a body temperature in the normal range will improve Outcome: Progressing   Problem: Respiratory: Goal: Ability to maintain adequate ventilation will improve Outcome: Progressing Goal: Ability to maintain a clear airway will improve Outcome:  Progressing   

## 2018-04-10 ENCOUNTER — Encounter
Admission: RE | Admit: 2018-04-10 | Discharge: 2018-04-10 | Disposition: A | Discharge status: Home / Self Care | Payer: Medicare (Managed Care) | Source: Ambulatory Visit | Attending: Internal Medicine | Admitting: Internal Medicine

## 2018-04-10 LAB — GLUCOSE, CAPILLARY
Glucose-Capillary: 156 mg/dL — ABNORMAL HIGH (ref 70–99)
Glucose-Capillary: 191 mg/dL — ABNORMAL HIGH (ref 70–99)

## 2018-04-10 MED ORDER — MODAFINIL 100 MG PO TABS: 100.0000 mg | ORAL_TABLET | Freq: Every day | ORAL | Status: AC

## 2018-04-10 MED ORDER — AMOXICILLIN-POT CLAVULANATE 125-31.25 MG/5ML PO SUSR: 125.0000 mg | Freq: Two times a day (BID) | ORAL | 0 refills | Status: AC

## 2018-04-10 NOTE — Clinical Social Work Note (Signed)
CS[facility]. [facility] and [employer] [facility] has been sent. CS[facility] and [facility] will call family and [facility] is going to transport in about 1 hour from now. Edward SpanielMonica Tynisha Kim MSW,LCSW [phone]

## 2018-04-10 NOTE — Care Management Important Message (Signed)
Reviewed Medicare IM right with Myrle ShengOmette Corbett [phone]), patient's niece.  She is aware and in agreement with discharge plan.

## 2018-04-10 NOTE — Discharge Summary (Signed)
[facility] - [place] at [facility]  Edward Kim, 84 y.o., DOB 1922/03/17, MRN [id number]. Admission date: 04/06/2018 Discharge Date 04/10/2018 Primary MD Patient, No Pcp Per Admitting Physician Arnaldo NatalMichael S Diamond, MD  Admission Diagnosis  Vomiting [R11.10]  Discharge Diagnosis   Active Problems:   Aspiration pneumonia [facility](HCC) Advanced dementia Essential hypertension Diabetes type 2 Generalized weakness    Hospital Course Patient is a 84 year old admitted with aspiration pneumonia.  Patient was treated with antibiotics repeat chest x-ray shows persistent aspiration on the other side.  I discussed with the patient's niece she states that she understands that her uncle's dementia has gotten worse.  At this point we will treat with oral antibiotics but I told her that he will likely aspirate again.  And recommended comfort care measures if things were to get worse.  Also updated Dr. Victory Dakiniley of the [facility] regarding this decision.            Consults  None  Significant Tests:  See full reports for all details     Ct Abdomen Pelvis W Contrast  Result Date: 04/07/2018 CLINICAL DATA:  84 y/o M; nausea and vomiting. History of dementia. EXAM: CT ABDOMEN AND PELVIS WITH CONTRAST TECHNIQUE: Multidetector CT imaging of the abdomen and pelvis was performed using the standard protocol following bolus administration of intravenous contrast. CONTRAST:  100mL ISOVUE-300 IOPAMIDOL (ISOVUE-300) INJECTION 61% COMPARISON:  None. FINDINGS: Lower chest: Bilateral lower lobe peribronchial thickening, scattered airway occlusion, and clustered nodules in bronchovascular distribution. Aortic valve replacement. Severe coronary artery calcification. Small hiatal hernia. Hepatobiliary: No focal liver abnormality is seen. No gallstones, gallbladder wall thickening, or biliary dilatation. Pancreas: Unremarkable. No pancreatic ductal dilatation or surrounding inflammatory changes. Spleen: Normal  in size without focal abnormality. Adrenals/Urinary Tract: Normal adrenal glands. Multiple adrenal cysts measuring up to 14.5 cm arising from the upper pole of left kidney. No hydronephrosis. Normal bladder. Stomach/Bowel: Stomach is within normal limits. Appendix appears normal. No evidence of bowel wall thickening, distention, or inflammatory changes. Vascular/Lymphatic: Severe aortic atherosclerosis. No enlarged abdominal or pelvic lymph nodes. Reproductive: Moderate prostate enlargement extending into the floor of the bladder. Other: No abdominal wall hernia or abnormality. No abdominopelvic ascites. Musculoskeletal: Mild-to-moderate osteoarthrosis of the visible spine and hips. No acute osseous abnormality identified. IMPRESSION: 1. Peribronchial thickening and clustered nodules in the lung bases may represent bronchopneumonia or aspiration given recent vomiting. 2. Small hiatal hernia. 3. Severe coronary artery and aortic calcific atherosclerosis. 4. Multiple renal cysts measuring up to 14.5 cm arising from the upper pole of left kidney. Electronically Signed   By: Mitzi HansenLance  Furusawa-Stratton M.D.   On: 04/07/2018 02:51   Dg Chest Port 1 View  Result Date: 04/09/2018 CLINICAL DATA:  Pneumonia. EXAM: PORTABLE CHEST 1 VIEW COMPARISON:  Radiograph of April 06, 2018. FINDINGS: Stable cardiomediastinal silhouette. No pneumothorax or pleural effusion is noted. New lingular opacity is noted concerning for pneumonia or atelectasis. Right lung is clear. Bony thorax is unremarkable. IMPRESSION: New left lingular opacity is noted concerning for pneumonia or atelectasis. Electronically Signed   By: Lupita RaiderJames  Green Jr, M.D.   On: 04/09/2018 15:47   Dg Chest Port 1 View  Result Date: 04/06/2018 CLINICAL DATA:  84 y/o  M; vomiting. EXAM: PORTABLE CHEST 1 VIEW COMPARISON:  12/25/2016 chest radiograph FINDINGS: Left lung base opacity. Stable cardiac silhouette given projection and technique. Calcific aortic atherosclerosis.  Multilevel degenerative changes of the spine. No acute osseous abnormality is evident. IMPRESSION: Left lung base opacity may  represent atelectasis, pneumonia, or aspiration given recent vomiting. Electronically Signed   By: Mitzi Hansen M.D.   On: 04/06/2018 22:20       Today   Subjective:   Corinne Ports patient confused unable to provide any review of systems  Objective:   Blood pressure (!) 147/79, pulse 91, temperature 97.6 F (36.4 C), temperature source Axillary, resp. rate 20, height 6\' 1"  (1.854 m), weight 79.6 kg, SpO2 97 %.  .  Intake/Output Summary (Last 24 hours) at 04/10/2018 1133 Last data filed at 04/10/2018 0900 Gross per 24 hour  Intake 2870.69 ml  Output -  Net 2870.69 ml    Exam VITAL SIGNS: Blood pressure (!) 147/79, pulse 91, temperature 97.6 F (36.4 C), temperature source Axillary, resp. rate 20, height 6\' 1"  (1.854 m), weight 79.6 kg, SpO2 97 %.  GENERAL:  84 y.o.-year-old patient lying in the bed with no acute distress.  EYES: Pupils equal, round, reactive to light and accommodation. No scleral icterus. Extraocular muscles intact.  HEENT: Head atraumatic, normocephalic. Oropharynx and nasopharynx clear.  NECK:  Supple, no jugular venous distention. No thyroid enlargement, no tenderness.  LUNGS: Normal breath sounds bilaterally, no wheezing, rales,rhonchi or crepitation. No use of accessory muscles of respiration.  CARDIOVASCULAR: S1, S2 normal. No murmurs, rubs, or gallops.  ABDOMEN: Soft, nontender, nondistended. Bowel sounds present. No organomegaly or mass.  EXTREMITIES: No pedal edema, cyanosis, or clubbing.  NEUROLOGIC: Drowsy  pSYCHIATRIC: T drowsy SKIN: No obvious rash, lesion, or ulcer.   Data Review     CBC w Diff:  Lab Results  Component Value Date   WBC 8.9 04/06/2018   HGB 13.8 04/06/2018   HCT 41.1 04/06/2018   PLT 235 04/06/2018   LYMPHOPCT 49 12/25/2016   MONOPCT 9 12/25/2016   EOSPCT 2 12/25/2016   BASOPCT 1  12/25/2016   CMP:  Lab Results  Component Value Date   NA 136 04/06/2018   K 3.9 04/06/2018   CL 100 04/06/2018   CO2 29 04/06/2018   BUN 17 04/06/2018   CREATININE 0.64 04/09/2018   PROT 6.9 04/06/2018   ALBUMIN 3.6 04/06/2018   BILITOT 0.3 04/06/2018   ALKPHOS 55 04/06/2018   AST 18 04/06/2018   ALT 10 04/06/2018  .  Micro Results Recent Results (from the past 240 hour(s))  MRSA PCR Screening     Status: None   Collection Time: 04/07/18  5:25 AM  Result Value Ref Range Status   MRSA by PCR NEGATIVE NEGATIVE Final    Comment:        The GeneXpert MRSA Assay (FDA approved for NASAL specimens only), is one component of a comprehensive MRSA colonization surveillance program. It is not intended to diagnose MRSA infection nor to guide or monitor treatment for MRSA infections. Performed at [facility], [address] [place], [place] [address]         Code Status Orders  (From admission, onward)         Start     Ordered   04/07/18 1116  Do not attempt resuscitation (DNR)  Continuous    Question Answer Comment  In the event of cardiac or respiratory ARREST Do not call a "code blue"   In the event of cardiac or respiratory ARREST Do not perform Intubation, CPR, defibrillation or ACLS   In the event of cardiac or respiratory ARREST Use medication by any route, position, wound care, and other measures to relive pain and suffering. May use oxygen, suction  and manual treatment of airway obstruction as needed for comfort.   Comments RN may pronounce      04/07/18 1116        Code Status History    Date Active Date Inactive Code Status Order ID Comments User Context   04/07/2018 0514 04/07/2018 1115 Full Code [id number]  Arnaldo Natal, MD Inpatient   12/26/2016 0308 12/27/2016 1904 Full Code [id number]  Arnaldo Natal, MD ED   11/01/2015 1747 11/03/2015 1403 Full Code [id number]  Katharina Caper, MD Inpatient    Advance Directive Documentation     Most  Recent Value  Type of Advance Directive  Healthcare Power of Attorney, Living will  Pre-existing out of facility DNR order (yellow form or pink MOST form)  Yellow form placed in chart (order not valid for inpatient use)  "MOST" Form in Place?  -          Follow-up Information    md at [facility] Follow up.           Discharge Medications   Allergies as of 04/10/2018   No Known Allergies     Medication List    STOP taking these medications   traZODone 100 MG tablet Commonly known as:  DESYREL     TAKE these medications   acetaminophen 500 MG tablet Commonly known as:  TYLENOL Take 500 mg by mouth every 8 (eight) hours as needed for mild pain or fever.   acetaminophen 325 MG tablet Commonly known as:  TYLENOL Take 650 mg by mouth every 4 (four) hours as needed for moderate pain or fever.   acetaminophen 650 MG suppository Commonly known as:  TYLENOL Place 650 mg rectally every 4 (four) hours as needed for moderate pain or fever.   amoxicillin-clavulanate 125-31.25 MG/5ML suspension Commonly known as:  AUGMENTIN Take 5 mLs (125 mg total) by mouth 2 (two) times daily for 5 days.   busPIRone 7.5 MG tablet Commonly known as:  BUSPAR Take 7.5 mg by mouth 2 (two) times daily.   fluticasone 50 MCG/ACT nasal spray Commonly known as:  FLONASE Place 1 spray into both nostrils daily.   guaiFENesin 100 MG/5ML liquid Commonly known as:  ROBITUSSIN Take 200 mg by mouth 4 (four) times daily as needed for congestion.   guaiFENesin 600 MG 12 hr tablet Commonly known as:  MUCINEX Take 600 mg by mouth 2 (two) times daily as needed (congestion).   modafinil 100 MG tablet Commonly known as:  PROVIGIL Take 1 tablet (100 mg total) by mouth daily.          Total Time in preparing paper work, data evaluation and todays exam - 35 minutes  Auburn Bilberry M.D on 04/10/2018 at 11:33 AM [facility]   Office  [phone]

## 2018-06-17 IMAGING — CR DG PELVIS 1-2V
1 series · 1 of 1 positions shown · non-contrast
Comparison: None.

CLINICAL DATA: Unwitnessed fall. Bump over the left hip, nontender
to palpation.

EXAM:
PELVIS - 1-2 VIEW

[dg pelvis 1-2 views]
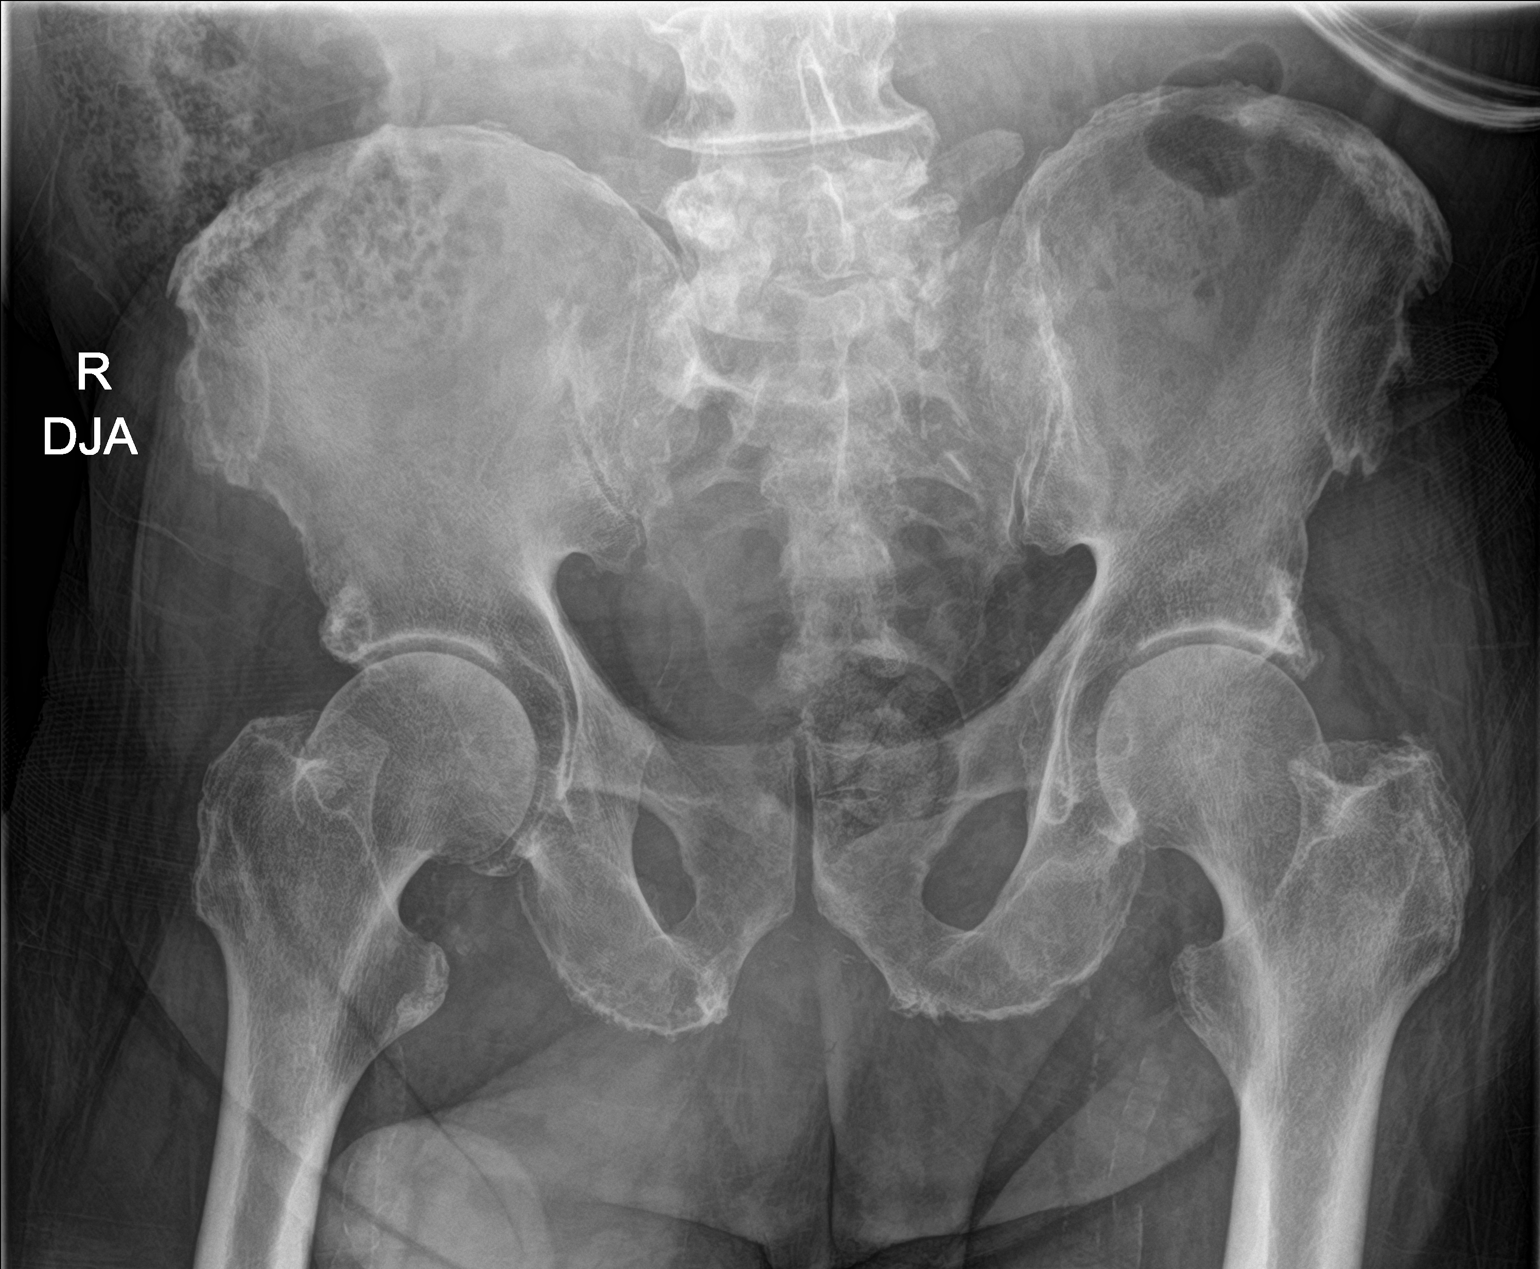

[1 of 1 positions shown; findings below may reference images not displayed]

FINDINGS: There is no evidence of acute pelvic fracture or diastasis.
Osteoarthritic subchondral sclerosis and cystic change noted of both
acetabular components. Mild joint space narrowing of both hips.
There is lower lumbar degenerative facet arthropathy at L5-S1
bilaterally. SI joint osteoarthritis with sclerosis and joint space
narrowing is seen bilaterally. No pelvic bone lesions are seen.
IMPRESSION: No acute osseous abnormality of the pelvis and hips.
